# Patient Record
Sex: Female | Born: 1970 | Race: White | Hispanic: No | Smoking: Never smoker
Health system: Southern US, Community
[De-identification: ages and names within clinical notes are randomized; demographics above are authoritative.]

## PROBLEM LIST (undated history)

## (undated) DIAGNOSIS — L509 Urticaria, unspecified: Secondary | ICD-10-CM

## (undated) DIAGNOSIS — G43909 Migraine, unspecified, not intractable, without status migrainosus: Secondary | ICD-10-CM

## (undated) HISTORY — DX: Urticaria, unspecified: L50.9

## (undated) HISTORY — DX: Migraine, unspecified, not intractable, without status migrainosus: G43.909

---

## 1995-09-30 HISTORY — PX: THYROIDECTOMY, PARTIAL: SHX18

## 2017-11-18 ENCOUNTER — Encounter: Payer: Self-pay | Admitting: Sports Medicine

## 2017-11-18 ENCOUNTER — Ambulatory Visit (INDEPENDENT_AMBULATORY_CARE_PROVIDER_SITE_OTHER): Payer: 59 | Admitting: Sports Medicine

## 2017-11-18 ENCOUNTER — Ambulatory Visit (INDEPENDENT_AMBULATORY_CARE_PROVIDER_SITE_OTHER): Payer: 59

## 2017-11-18 VITALS — Ht 62.0 in | Wt 130.0 lb

## 2017-11-18 DIAGNOSIS — M722 Plantar fascial fibromatosis: Secondary | ICD-10-CM | POA: Diagnosis not present

## 2017-11-18 DIAGNOSIS — M79672 Pain in left foot: Secondary | ICD-10-CM | POA: Diagnosis not present

## 2017-11-18 DIAGNOSIS — M2140 Flat foot [pes planus] (acquired), unspecified foot: Secondary | ICD-10-CM

## 2017-11-18 MED ORDER — TRIAMCINOLONE ACETONIDE 10 MG/ML IJ SUSP: 10.0000 mg | Freq: Once | INTRAMUSCULAR | Status: DC

## 2017-11-18 MED ORDER — MELOXICAM 15 MG PO TABS: 15.0000 mg | ORAL_TABLET | Freq: Every day | ORAL | 0 refills | Status: DC

## 2017-11-18 MED ORDER — METHYLPREDNISOLONE 4 MG PO TBPK: ORAL_TABLET | ORAL | 0 refills | Status: DC

## 2017-11-18 NOTE — Progress Notes (Signed)
Subjective: Christina Nixon is a 47 y.o. female patient presents to office with complaint of moderate heel pain on the left since December.  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  Patient has treated this problem with massage, stretching, icing, night splint, changing shoes, arch pillow, and over-the-counter inserts with no relief. Denies any other pedal complaints.   Review of Systems  Musculoskeletal:       Pain in left heel  All other systems reviewed and are negative.    There are no active problems to display for this patient.   No current outpatient medications on file prior to visit.   No current facility-administered medications on file prior to visit.     Allergies not on file  Objective: Physical Exam General: The patient is alert and oriented x3 in no acute distress.  Dermatology: Skin is warm, dry and supple bilateral lower extremities. Nails 1-10 are normal. There is no erythema, no edema, there is faint eccymosis at sinus tarsi on left, no open lesions present. Integument is otherwise unremarkable.  Vascular: Dorsalis Pedis pulse and Posterior Tibial pulse are 2/4 bilateral. Capillary fill time is immediate to all digits.  Neurological: Grossly intact to light touch with an achilles reflex of +2/5 and a  negative Tinel's sign bilateral.  Subjective tingling along the left plantar surface.  Musculoskeletal: Tenderness to palpation at the medial calcaneal tubercale and through the insertion of the plantar fascia on the left foot. No pain with compression of calcaneus bilateral. No pain with tuning fork to calcaneus bilateral. No pain with calf compression bilateral. There is decreased Ankle joint range of  motion bilateral. + Severe pes planus deformity bilateral left greater than right. All other joints range of motion within normal limits bilateral. Strength 5/5 in all groups bilateral.   Gait: Unassisted, Antalgic avoid weight on left/right heel  Xray, Left foot:  Normal osseous mineralization. Joint spaces preserved. No fracture/dislocation/boney destruction. Calcaneal spur present with mild thickening of plantar fascia. No other soft tissue abnormalities or radiopaque foreign bodies.   Assessment and Plan: Problem List Items Addressed This Visit    None    Visit Diagnoses    Plantar fasciitis, left    -  Primary   Relevant Medications   methylPREDNISolone (MEDROL DOSEPAK) 4 MG TBPK tablet   meloxicam (MOBIC) 15 MG tablet   triamcinolone acetonide (KENALOG) 10 MG/ML injection 10 mg (Start on 11/18/2017  5:45 PM)   Other Relevant Orders   DG Foot Complete Left   Pes planus, unspecified laterality       Relevant Orders   DG Foot Complete Left   Left foot pain       Relevant Orders   DG Foot Complete Left      -Complete examination performed.  -Xrays reviewed -Discussed with patient in detail the condition of plantar fasciitis, how this occurs and general treatment options. Explained both conservative and surgical treatments.  -After oral consent and aseptic prep, injected a mixture containing 1 ml of 2%  plain lidocaine, 1 ml 0.5% plain marcaine, 0.5 ml of kenalog 10 and 0.5 ml of dexamethasone phosphate into left heel -Rx Meloxicam to start after Medrol dose pack is completed -Recommended  good supportive shoes and advised use of OTC insert. Explained to patient that if these orthoses work well, we will continue with these. If these do not improve her condition and  pain, we will consider custom molded orthoses. -Explained in detail the use of the fascial brace for the left which was dispensed at today's visit. -Patient to resume the night splint that she already has at  home -Explained and dispensed to patient daily stretching exercises. -Recommend patient to ice affected area 1-2x daily. -Patient to return to office in 3-4 weeks for follow up or sooner if problems or questions arise.  Christina Nixon, DPM

## 2017-11-18 NOTE — Patient Instructions (Signed)

## 2017-12-11 ENCOUNTER — Encounter: Payer: Self-pay | Admitting: Sports Medicine

## 2017-12-11 ENCOUNTER — Ambulatory Visit (INDEPENDENT_AMBULATORY_CARE_PROVIDER_SITE_OTHER): Payer: 59 | Admitting: Sports Medicine

## 2017-12-11 DIAGNOSIS — M79672 Pain in left foot: Secondary | ICD-10-CM

## 2017-12-11 DIAGNOSIS — M722 Plantar fascial fibromatosis: Secondary | ICD-10-CM | POA: Diagnosis not present

## 2017-12-11 MED ORDER — TRIAMCINOLONE ACETONIDE 40 MG/ML IJ SUSP: 20.0000 mg | Freq: Once | INTRAMUSCULAR | Status: DC

## 2017-12-11 NOTE — Progress Notes (Signed)
Subjective: Christina Nixon is a 47 y.o. female returns to office for follow up evaluation after Left heel injection for plantar fasciitis, injection #1 administered 3 weeks ago. Patient states that the injection seems to help her pain; pain is now 3/10 or 70% better and has  decreased in frequency to the area. Patient states that she has been compliant with using her night splint icing stretching however when she tried to use the plantar fascial brace feel it rubbing and felt like it irritated the area of her heel.  Patient states that she has also completed her oral medications with no issues. Patient states on Monday she walked her dog half a mile and did not have any pain.  States that her heel cushions helped tremendously.  Denies any recent changes in medications or new problems since last visit.   There are no active problems to display for this patient.   Current Outpatient Medications on File Prior to Visit  Medication Sig Dispense Refill  . meloxicam (MOBIC) 15 MG tablet Take 1 tablet (15 mg total) by mouth daily. 30 tablet 0  . methylPREDNISolone (MEDROL DOSEPAK) 4 MG TBPK tablet Take 1st as instructed 21 tablet 0   Current Facility-Administered Medications on File Prior to Visit  Medication Dose Route Frequency Provider Last Rate Last Dose  . triamcinolone acetonide (KENALOG) 10 MG/ML injection 10 mg  10 mg Other Once Asencion IslamStover, Lelia Jons, DPM        Not on File  Objective:   General:  Alert and oriented x 3, in no acute distress  Dermatology: Skin is warm, dry, and supple bilateral. Nails are within normal limits. There is no lower extremity erythema, no eccymosis, no open lesions present bilateral.   Vascular: Dorsalis Pedis and Posterior Tibial pedal pulses are 2/4 bilateral. + hair growth noted bilateral. Capillary Fill Time is 3 seconds in all digits. No varicosities, No edema bilateral lower extremities.   Neurological: Sensation grossly intact to light touch with an achilles  reflex of +2 and a  negative Tinel's sign bilateral. Vibratory, sharp/dull, Semmes Weinstein Monofilament within normal limits.   Musculoskeletal: There is decreased tenderness to palpation at the medial calcaneal tubercale and through the insertion of the plantar fascia on the Left foot. No pain with compression to calcaneus or application of tuning fork. There is decreased Ankle joint range of motion bilateral. All other joints range of motion within normal limits bilateral. + Pes planus deformity bilateral left greater than right.  Strength 5/5 bilateral.   Assessment and Plan: Problem List Items Addressed This Visit    None    Visit Diagnoses    Plantar fasciitis, left    -  Primary   Relevant Medications   triamcinolone acetonide (KENALOG-40) injection 20 mg   Left foot pain         -Complete examination performed.  -Previous x-rays reviewed. -Discussed with patient in detail the condition of plantar fasciitis, how this occurs related to the foot type of the patient and general treatment options. - Patient opted for another injection today; After oral consent and aseptic prep, injected a mixture containing 1 ml of 1%plain lidocaine, 1 ml 0.5% plain marcaine, 0.5 ml of kenalog 40 and 0.5 ml of dexmethasone phosphate to left heel at area of most pain/trigger point injection. -Dispensed Left heel lifts -Patient to finish off the meloxicam PRN  -Continue with stretching, icing, good supportive shoes.  -Discussed long term care and reocurrence; will closely monitor; if fails to improve  will consider other treatment modalities.  -Patient to return to office in 1 month for follow up or sooner if problems or questions arise.  Asencion Islam, DPM

## 2018-01-08 ENCOUNTER — Ambulatory Visit: Payer: 59 | Admitting: Sports Medicine

## 2018-01-10 DIAGNOSIS — Z01419 Encounter for gynecological examination (general) (routine) without abnormal findings: Secondary | ICD-10-CM | POA: Insufficient documentation

## 2018-01-10 DIAGNOSIS — R0789 Other chest pain: Secondary | ICD-10-CM | POA: Insufficient documentation

## 2018-01-10 DIAGNOSIS — N921 Excessive and frequent menstruation with irregular cycle: Secondary | ICD-10-CM | POA: Insufficient documentation

## 2018-01-20 ENCOUNTER — Ambulatory Visit (INDEPENDENT_AMBULATORY_CARE_PROVIDER_SITE_OTHER): Payer: 59 | Admitting: Sports Medicine

## 2018-01-20 ENCOUNTER — Encounter: Payer: Self-pay | Admitting: Sports Medicine

## 2018-01-20 DIAGNOSIS — M722 Plantar fascial fibromatosis: Secondary | ICD-10-CM | POA: Diagnosis not present

## 2018-01-20 DIAGNOSIS — M79672 Pain in left foot: Secondary | ICD-10-CM

## 2018-01-20 DIAGNOSIS — M2140 Flat foot [pes planus] (acquired), unspecified foot: Secondary | ICD-10-CM

## 2018-01-20 MED ORDER — TRIAMCINOLONE ACETONIDE 40 MG/ML IJ SUSP: 20.0000 mg | Freq: Once | INTRAMUSCULAR | Status: DC

## 2018-01-20 NOTE — Progress Notes (Signed)
Subjective: Christina Nixon is a 47 y.o. female returns to office for follow up evaluation after Left heel injection for plantar fasciitis, injection #2 administered 5 weeks ago. Patient states that the injection seems to help her pain; pain is now 3-4/10 and has  decreased in frequency to the area but not yet completely gone states that for the most part she can do normal activities however after some episodes of yard work has had a flareup. Patient states that she has been compliant with using her night splint, icing, stretching, and wearing good supportive shoes and wearing her heel cushions.  Denies any recent changes in medications or new problems since last visit.   There are no active problems to display for this patient.   Current Outpatient Medications on File Prior to Visit  Medication Sig Dispense Refill  . meloxicam (MOBIC) 15 MG tablet Take 1 tablet (15 mg total) by mouth daily. 30 tablet 0  . methylPREDNISolone (MEDROL DOSEPAK) 4 MG TBPK tablet Take 1st as instructed 21 tablet 0   Current Facility-Administered Medications on File Prior to Visit  Medication Dose Route Frequency Provider Last Rate Last Dose  . triamcinolone acetonide (KENALOG) 10 MG/ML injection 10 mg  10 mg Other Once Callery, Earle Troiano, DPM      . triamcinolone acetonide (KENALOG-40) injection 20 mg  20 mg Other Once Asencion Islam, DPM        Not on File  Objective:   General:  Alert and oriented x 3, in no acute distress  Dermatology: Skin is warm, dry, and supple bilateral. Nails are within normal limits. There is no lower extremity erythema, no eccymosis, no open lesions present bilateral.   Vascular: Dorsalis Pedis and Posterior Tibial pedal pulses are 2/4 bilateral. + hair growth noted bilateral. Capillary Fill Time is 3 seconds in all digits. No varicosities, No edema bilateral lower extremities.   Neurological: Sensation grossly intact to light touch with an achilles reflex of +2 and a  negative  Tinel's sign bilateral. Vibratory, sharp/dull, Semmes Weinstein Monofilament within normal limits.   Musculoskeletal: There is decreased tenderness to palpation at the medial calcaneal tubercale and through the insertion of the plantar fascia on the Left foot. No pain with compression to calcaneus or application of tuning fork. There is decreased Ankle joint range of motion bilateral. All other joints range of motion within normal limits bilateral. + Pes planus deformity bilateral left greater than right.  Strength 5/5 bilateral.   Assessment and Plan: Problem List Items Addressed This Visit    None    Visit Diagnoses    Plantar fasciitis, left    -  Primary   Relevant Medications   triamcinolone acetonide (KENALOG-40) injection 20 mg   Left foot pain       Pes planus, unspecified laterality         -Complete examination performed.  -Previous x-rays reviewed. -Discussed with patient in detail the condition of plantar fasciitis, how this occurs related to the foot type of the patient and general treatment options. - Patient opted for another injection today; After oral consent and aseptic prep, injected a mixture containing 1 ml of 1%plain lidocaine, 1 ml 0.5% plain marcaine, 0.5 ml of kenalog 40 and 0.5 ml of dexmethasone phosphate to left heel at area of most pain/trigger point injection.  This is injection #3 to the site. -Dispensed Left heel lifts -Applied plantar fascial strapping to left foot and advised patient to keep in place for 5 days -Continue with  stretching, icing, good supportive shoes.  -Discussed long term care and reocurrence; will closely monitor; if fails to improve will consider other treatment modalities.  -Patient to return to office in 3 to 4 weeks for follow up or sooner if problems or questions arise.  Asencion Islamitorya Migdalia Olejniczak, DPM

## 2018-01-20 NOTE — Patient Instructions (Signed)

## 2018-02-11 ENCOUNTER — Ambulatory Visit: Payer: 59 | Admitting: Sports Medicine

## 2018-02-12 ENCOUNTER — Encounter: Payer: Self-pay | Admitting: Sports Medicine

## 2018-02-12 ENCOUNTER — Ambulatory Visit (INDEPENDENT_AMBULATORY_CARE_PROVIDER_SITE_OTHER): Payer: 59 | Admitting: Sports Medicine

## 2018-02-12 DIAGNOSIS — M79672 Pain in left foot: Secondary | ICD-10-CM

## 2018-02-12 DIAGNOSIS — M2140 Flat foot [pes planus] (acquired), unspecified foot: Secondary | ICD-10-CM

## 2018-02-12 DIAGNOSIS — M722 Plantar fascial fibromatosis: Secondary | ICD-10-CM | POA: Diagnosis not present

## 2018-02-12 NOTE — Progress Notes (Signed)
Subjective: Christina Nixon is a 47 y.o. female returns to office for follow up evaluation after Left heel injection for plantar fasciitis, injection #3 administered 4 weeks ago. Patient states that the injection seems to help her pain; pain is now 1-2/10 and has  decreased in frequency to the area; had a flare after mowing and after beach trip. Patient states that she has been compliant with using her night splint, icing, stretching, and wearing good supportive shoes and wearing her heel cushions.  Denies any recent changes in medications or new problems since last visit.   There are no active problems to display for this patient.   Current Outpatient Medications on File Prior to Visit  Medication Sig Dispense Refill  . meloxicam (MOBIC) 15 MG tablet Take 1 tablet (15 mg total) by mouth daily. 30 tablet 0  . methylPREDNISolone (MEDROL DOSEPAK) 4 MG TBPK tablet Take 1st as instructed 21 tablet 0   Current Facility-Administered Medications on File Prior to Visit  Medication Dose Route Frequency Provider Last Rate Last Dose  . triamcinolone acetonide (KENALOG) 10 MG/ML injection 10 mg  10 mg Other Once Powell, Mubarak Bevens, DPM      . triamcinolone acetonide (KENALOG-40) injection 20 mg  20 mg Other Once Hamilton, Royelle Hinchman, DPM      . triamcinolone acetonide (KENALOG-40) injection 20 mg  20 mg Other Once Asencion Islam, DPM        Not on File  Objective:   General:  Alert and oriented x 3, in no acute distress  Dermatology: Skin is warm, dry, and supple bilateral. Nails are within normal limits. There is no lower extremity erythema, no eccymosis, no open lesions present bilateral.   Vascular: Dorsalis Pedis and Posterior Tibial pedal pulses are 2/4 bilateral. + hair growth noted bilateral. Capillary Fill Time is 3 seconds in all digits. No varicosities, No edema bilateral lower extremities.   Neurological: Sensation grossly intact to light touch with an achilles reflex of +2 and a  negative  Tinel's sign bilateral. Vibratory, sharp/dull, Semmes Weinstein Monofilament within normal limits.   Musculoskeletal: There is decreased tenderness to palpation at the central> medial calcaneal tubercale and through the insertion of the plantar fascia on the Left foot. No pain with compression to calcaneus or application of tuning fork. There is decreased Ankle joint range of motion bilateral. All other joints range of motion within normal limits bilateral. + Pes planus deformity bilateral left greater than right.  Strength 5/5 bilateral.   Assessment and Plan: Problem List Items Addressed This Visit    None    Visit Diagnoses    Plantar fasciitis, left    -  Primary   Left foot pain       Pes planus, unspecified laterality         -Complete examination performed.  -Previous x-rays reviewed. -Re-Discussed with patient in detail the condition of plantar fasciitis, how this occurs related to the foot type of the patient and general treatment options. -Applied plantar fascial strapping to left foot and advised patient to keep in place for 5 days -Continue with stretching and good supportive shoes. -May continue with Mobic and icing for flares -Office to check orthotic benefits (accommodative style was what the patient prefers)  -Discussed long term care and reocurrence; will closely monitor; if fails to improve will consider other treatment modalities.  -Patient to return to office in 4 weeks for [facility] and for follow up eval or sooner if problems or questions arise.  Brendolyn Stockley  [name], DPM

## 2018-03-04 ENCOUNTER — Encounter: Payer: Self-pay | Admitting: Sports Medicine

## 2018-03-04 ENCOUNTER — Telehealth: Payer: Self-pay | Admitting: *Deleted

## 2018-03-04 ENCOUNTER — Ambulatory Visit (INDEPENDENT_AMBULATORY_CARE_PROVIDER_SITE_OTHER): Payer: 59 | Admitting: Sports Medicine

## 2018-03-04 DIAGNOSIS — M722 Plantar fascial fibromatosis: Secondary | ICD-10-CM

## 2018-03-04 MED ORDER — NONFORMULARY OR COMPOUNDED ITEM: 2 refills | Status: DC

## 2018-03-04 NOTE — Addendum Note (Signed)
Addended by: Alphia Kava'CONNELL, Brissa Asante D on: 03/04/2018 06:47 PM   Modules accepted: Orders

## 2018-03-04 NOTE — Progress Notes (Signed)
Subjective: Christina Nixon is a 47 y.o. female returns to office for follow up evaluation left heel pain.  Patient also reports that this visit that her right foot is starting to hurt the same pain is still about 1-2 out of 10 with occasional flares especially with yard work and with intensive house cleaning.  Patient states that she has been compliant with using her night splint, icing, stretching, and wearing good supportive shoes and wearing her heel cushions.  Reports that she only kept the taping on the last time for 1 day and states that she did a lot of yard work and it got dirty so had to remove it.  Patient denies any recent changes in medications or new problems since last visit.   There are no active problems to display for this patient.   Current Outpatient Medications on File Prior to Visit  Medication Sig Dispense Refill  . meloxicam (MOBIC) 15 MG tablet Take 1 tablet (15 mg total) by mouth daily. 30 tablet 0  . methylPREDNISolone (MEDROL DOSEPAK) 4 MG TBPK tablet Take 1st as instructed 21 tablet 0   Current Facility-Administered Medications on File Prior to Visit  Medication Dose Route Frequency Provider Last Rate Last Dose  . triamcinolone acetonide (KENALOG) 10 MG/ML injection 10 mg  10 mg Other Once [place], Allexis Bordenave, DPM      . triamcinolone acetonide (KENALOG-40) injection 20 mg  20 mg Other Once Kirklin, Kaleab Frasier, DPM      . triamcinolone acetonide (KENALOG-40) injection 20 mg  20 mg Other Once Asencion Islam, DPM        Not on File  Objective:   General:  Alert and oriented x 3, in no acute distress  Dermatology: Skin is warm, dry, and supple bilateral. Nails are within normal limits. There is no lower extremity erythema, no eccymosis, no open lesions present bilateral.   Vascular: Dorsalis Pedis and Posterior Tibial pedal pulses are 2/4 bilateral. + hair growth noted bilateral. Capillary Fill Time is 3 seconds in all digits. No varicosities, No edema bilateral lower  extremities.   Neurological: Sensation grossly intact to light touch with an achilles reflex of +2 and a  negative Tinel's sign bilateral. Vibratory, sharp/dull, Semmes Weinstein Monofilament within normal limits.   Musculoskeletal: There is decreased tenderness to palpation at the central> medial calcaneal tubercale and through the insertion of the plantar fascia on the Left foot however there is some mild pain along the insertion of the plantar fascia on the right. No pain with compression to calcaneus or application of tuning fork. There is decreased Ankle joint range of motion bilateral. All other joints range of motion within normal limits bilateral. + Pes planus deformity bilateral left greater than right.  Strength 5/5 bilateral.   Assessment and Plan: Problem List Items Addressed This Visit    None    Visit Diagnoses    Plantar fasciitis, left    -  Primary   Plantar fasciitis, right         -Complete examination performed.  -Previous x-rays reviewed. -Re-Discussed with patient in detail the condition of plantar fasciitis now bilateral, how this occurs related to the foot type of the patient and general treatment options. -Prescribed physical therapy at [facility] in [place] -Prescribe topical pain cream from [facility] to use as instructed -Continue with stretching and good supportive shoes. -Patient was evaluated and casted today for custom molded orthotics by pedorthotist -May continue with Mobic and icing for flares -Discussed long term care  and reocurrence; will closely monitor; if fails to improve will consider other treatment modalities if symptoms fail to improve. -Patient to return to office pick up orthotics or sooner if questions arise.  Asencion Islamitorya Porschea Borys, DPM

## 2018-03-04 NOTE — Telephone Encounter (Signed)
[facility]

## 2018-03-04 NOTE — Telephone Encounter (Signed)
-----   Message from Asencion Islamitorya Stover, [place] sent at 03/04/2018  3:50 PM EDT ----- Regarding: [facility]  Pain cream add ketamine

## 2018-03-05 DIAGNOSIS — Z8249 Family history of ischemic heart disease and other diseases of the circulatory system: Secondary | ICD-10-CM | POA: Insufficient documentation

## 2018-03-05 DIAGNOSIS — F988 Other specified behavioral and emotional disorders with onset usually occurring in childhood and adolescence: Secondary | ICD-10-CM | POA: Insufficient documentation

## 2018-03-05 DIAGNOSIS — I10 Essential (primary) hypertension: Secondary | ICD-10-CM | POA: Insufficient documentation

## 2018-04-06 ENCOUNTER — Other Ambulatory Visit: Payer: Self-pay | Admitting: Sports Medicine

## 2018-04-06 DIAGNOSIS — M722 Plantar fascial fibromatosis: Secondary | ICD-10-CM

## 2018-04-08 ENCOUNTER — Ambulatory Visit: Payer: 59 | Admitting: *Deleted

## 2018-04-08 DIAGNOSIS — M722 Plantar fascial fibromatosis: Secondary | ICD-10-CM

## 2018-04-08 DIAGNOSIS — M2140 Flat foot [pes planus] (acquired), unspecified foot: Secondary | ICD-10-CM

## 2018-04-08 NOTE — Patient Instructions (Signed)

## 2018-07-05 DIAGNOSIS — G4452 New daily persistent headache (NDPH): Secondary | ICD-10-CM | POA: Insufficient documentation

## 2018-07-11 ENCOUNTER — Emergency Department (HOSPITAL_COMMUNITY)
Admission: EM | Admit: 2018-07-11 | Discharge: 2018-07-12 | Disposition: A | Discharge status: Home / Self Care | Payer: Managed Care, Other (non HMO) | Attending: Emergency Medicine | Admitting: Emergency Medicine

## 2018-07-11 ENCOUNTER — Encounter (HOSPITAL_COMMUNITY): Payer: Self-pay | Admitting: Emergency Medicine

## 2018-07-11 ENCOUNTER — Emergency Department (HOSPITAL_COMMUNITY): Payer: Managed Care, Other (non HMO)

## 2018-07-11 DIAGNOSIS — R51 Headache: Secondary | ICD-10-CM | POA: Insufficient documentation

## 2018-07-11 DIAGNOSIS — Z79899 Other long term (current) drug therapy: Secondary | ICD-10-CM | POA: Diagnosis not present

## 2018-07-11 DIAGNOSIS — R519 Headache, unspecified: Secondary | ICD-10-CM

## 2018-07-11 MED ORDER — PROCHLORPERAZINE EDISYLATE 10 MG/2ML IJ SOLN
10.0000 mg | Freq: Once | INTRAMUSCULAR | Status: AC
  Administered 2018-07-11: 10 mg via INTRAVENOUS
  Filled 2018-07-11: qty 2

## 2018-07-11 MED ORDER — MORPHINE SULFATE (PF) 4 MG/ML IV SOLN
4.0000 mg | Freq: Once | INTRAVENOUS | Status: AC
  Administered 2018-07-11: 4 mg via INTRAVENOUS
  Filled 2018-07-11: qty 1

## 2018-07-11 MED ORDER — SODIUM CHLORIDE 0.9 % IV BOLUS
1000.0000 mL | Freq: Once | INTRAVENOUS | Status: AC
  Administered 2018-07-11: 1000 mL via INTRAVENOUS

## 2018-07-11 NOTE — ED Notes (Signed)
Pt refused CT scan. MD notified.

## 2018-07-11 NOTE — ED Notes (Signed)
Patient transported to MRI 

## 2018-07-11 NOTE — ED Triage Notes (Signed)
Reports headache ongoing since October 2nd.  Has been treating with family doctor and seen at [facility] ER.  Given headache cocktail earlier today.  Reports pain decreased but never went away.  Immediately returned upon leaving. They did not do CT scan as of yet.  Taking imitrex and aleve otc as well.

## 2018-07-11 NOTE — ED Provider Notes (Signed)
[facility] EMERGENCY DEPARTMENT Provider Note   CSN: [id number] Arrival date & time: 07/11/18  1933     History   Chief Complaint Chief Complaint  Patient presents with  . Headache    HPI Kelee Cunningham is a 47 y.o. female.  HPI Patient is a 47 year old female presents the emergency department ongoing severe headaches since June 30, 2018.  She has tried several over-the-counter medications including prescription Imitrex by her primary care physician.  None of this seems to be improving with her headache.  She denies weakness of her arms or legs.  She is been able to continue to work on her computer throughout the week.  She states she has a stressful job but she needs to continue to work.  She is recently had her eyes evaluated by an optometrist and she has no refractory issues.  She has not had imaging to this point.  There is a family history of clotting disorders but she has never had a DVT or pulmonary embolism.  She has no recent head injury or trauma.  No fevers or chills.  No neck pain or stiffness.  No altered mental status.  No ataxia or changes in her speech.  She presents the emergency department tonight requesting MRI imaging as requested by her primary care physician.  No other complaints.   History reviewed. No pertinent past medical history.  There are no active problems to display for this patient.   Past Surgical History:  Procedure Laterality Date  . THYROIDECTOMY, PARTIAL       OB History   None      Home Medications    Prior to Admission medications   Medication Sig Start Date End Date Taking? Authorizing Provider  meloxicam (MOBIC) 15 MG tablet TAKE 1 TABLET BY MOUTH EVERY DAY 04/09/18   Asencion Islam, DPM  methylPREDNISolone (MEDROL DOSEPAK) 4 MG TBPK tablet Take 1st as instructed 11/18/17   Asencion Islam, DPM  NONFORMULARY OR COMPOUNDED ITEM [facility]:  Combination Pain Cream - Baclofen 2%, Doxepin 5%, Gabapentin  6%, Topiramate 2%, Pentoxifylline 3%, Ketamine 10%, apply 1-2 grams to affected areas 3-4 times a day. 03/04/18   Asencion Islam, DPM    Family History No family history on file.  Social History Social History   Tobacco Use  . Smoking status: Never Smoker  . Smokeless tobacco: Never Used  Substance Use Topics  . Alcohol use: Yes    Alcohol/week: 2.0 standard drinks    Types: 2 Cans of beer per week    Comment: daily  . Drug use: Never     Allergies   Patient has no allergy information on record.   Review of Systems Review of Systems  All other systems reviewed and are negative.    Physical Exam Updated Vital Signs BP 114/79   Pulse 72   Temp 98.1 F (36.7 C) (Oral)   Resp 19   Ht 5\' 2"  (1.575 m)   Wt 58.1 kg   SpO2 97%   BMI 23.41 kg/m   Physical Exam  Constitutional: She is oriented to person, place, and time. She appears well-developed and well-nourished. No distress.  HENT:  Head: Normocephalic and atraumatic.  Eyes: Pupils are equal, round, and reactive to light. EOM are normal.  Neck: Normal range of motion.  Cardiovascular: Normal rate, regular rhythm and normal heart sounds.  Pulmonary/Chest: Effort normal and breath sounds normal.  Abdominal: Soft. She exhibits no distension. There is no tenderness.  Musculoskeletal:  Normal range of motion.  Neurological: She is alert and oriented to person, place, and time.  5/5 strength in major muscle groups of  bilateral upper and lower extremities. Speech normal. No facial asymetry.  Normal finger-to-nose bilaterally.  Skin: Skin is warm and dry.  Psychiatric: She has a normal mood and affect. Judgment normal.  Nursing note and vitals reviewed.    ED Treatments / Results  Labs (all labs ordered are listed, but only abnormal results are displayed) Labs Reviewed - No data to display  EKG None  Radiology No results found.  Procedures Procedures (including critical care time)  Medications Ordered in  ED Medications  prochlorperazine (COMPAZINE) injection 10 mg (10 mg Intravenous Given 07/11/18 2121)  morphine 4 MG/ML injection 4 mg (4 mg Intravenous Given 07/11/18 2121)  sodium chloride 0.9 % bolus 1,000 mL (0 mLs Intravenous Stopped 07/11/18 2223)     Initial Impression / Assessment and Plan / ED Course  I have reviewed the triage vital signs and the nursing notes.  Pertinent labs & imaging results that were available during my care of the patient were reviewed by me and considered in my medical decision making (see chart for details).     Headache cocktail now.  Atypical migraine type headache likely.  Given persistence of symptoms and no imaging at this point she will undergo MR brain, MR angiogram head, MRV.  If these are all negative the patient understands that she will need outpatient referral to neurology and ongoing follow-up with her primary care physician.  Care to [place], [place]  Final Clinical Impressions(s) / ED Diagnoses   Final diagnoses:  None    ED Discharge Orders    None       Azalia Bilis, MD 07/11/18 2228

## 2018-07-12 MED ORDER — PROCHLORPERAZINE MALEATE 10 MG PO TABS: 10.0000 mg | ORAL_TABLET | Freq: Three times a day (TID) | ORAL | 0 refills | Status: DC | PRN

## 2018-07-12 NOTE — Discharge Instructions (Addendum)
You can try taking the compazine with 800mg  ibuprofen and 25mg  of Benadryl for headache.   You could take this every 8 hours as needed.  Drink plenty of fluid.

## 2018-07-12 NOTE — ED Notes (Signed)
Patient Back from MRI

## 2018-07-12 NOTE — ED Provider Notes (Signed)
Patient signed out to me by Dr. Patria Mane awaiting MRs of head.  Patient has had persistent headache x about 2 weeks.  Sent to ED for MR.  MRs as below.  Recommend neurology follow-up.  Discussed follow-up plan with patient.  No results found for this or any previous visit. Mr Maxine Glenn Head Wo Contrast  Result Date: 07/12/2018 CLINICAL DATA:  Acute headache EXAM: MRI HEAD WITHOUT CONTRAST MRA HEAD WITHOUT CONTRAST MRV HEAD WITHOUT CONTRAST TECHNIQUE: Multiplanar, multiecho pulse sequences of the brain and surrounding structures were obtained without intravenous contrast. Angiographic images of the intracranial venous structures were obtained using MRV technique without intravenous contrast. Angiographic time-of-flight images of the intracranial arteries were obtained without intravenous contrast. COMPARISON:  None. FINDINGS: MRI HEAD FINDINGS BRAIN: There is no acute infarct, acute hemorrhage or mass effect. The midline structures are normal. There are no old infarcts. Scattered foci of hyperintense T2-weighted signal within the white matter in a nonspecific pattern that may be seen in the context of migraine headaches, but is also seen in asymptomatic patients. The CSF spaces are normal for age, with no hydrocephalus. Susceptibility-sensitive sequences show no chronic microhemorrhage or superficial siderosis. SKULL AND UPPER CERVICAL SPINE: The visualized skull base, calvarium, upper cervical spine and extracranial soft tissues are normal. SINUSES/ORBITS: No fluid levels or advanced mucosal thickening. No mastoid or middle ear effusion. The orbits are normal. MRA HEAD FINDINGS Intracranial internal carotid arteries: Normal. Anterior cerebral arteries: Normal. Middle cerebral arteries: Normal. Posterior communicating arteries: Not visualized Posterior cerebral arteries: Normal. Basilar artery: Normal. Vertebral arteries: Left dominant. Normal. Superior cerebellar arteries: Normal. Inferior cerebellar arteries:  Normal. MRV HEAD FINDINGS Superior sagittal sinus: Normal. Straight sinus: Normal. Inferior sagittal sinus, vein of Galen and internal cerebral veins: Normal. Transverse sinuses: Normal. Sigmoid sinuses: Normal. Visualized jugular veins: Normal. IMPRESSION: 1. Nonspecific scattered foci of white matter hyperintensity. Though this may be seen in the setting of migraine headaches or vasculopathy such as early chronic small vessel disease, it is also seen in normal patients of this age group. 2. Normal intracranial MRA/MRV. Electronically Signed   By: Deatra Robinson M.D.   On: 07/12/2018 00:37   Mr Brain Wo Contrast  Result Date: 07/12/2018 CLINICAL DATA:  Acute headache EXAM: MRI HEAD WITHOUT CONTRAST MRA HEAD WITHOUT CONTRAST MRV HEAD WITHOUT CONTRAST TECHNIQUE: Multiplanar, multiecho pulse sequences of the brain and surrounding structures were obtained without intravenous contrast. Angiographic images of the intracranial venous structures were obtained using MRV technique without intravenous contrast. Angiographic time-of-flight images of the intracranial arteries were obtained without intravenous contrast. COMPARISON:  None. FINDINGS: MRI HEAD FINDINGS BRAIN: There is no acute infarct, acute hemorrhage or mass effect. The midline structures are normal. There are no old infarcts. Scattered foci of hyperintense T2-weighted signal within the white matter in a nonspecific pattern that may be seen in the context of migraine headaches, but is also seen in asymptomatic patients. The CSF spaces are normal for age, with no hydrocephalus. Susceptibility-sensitive sequences show no chronic microhemorrhage or superficial siderosis. SKULL AND UPPER CERVICAL SPINE: The visualized skull base, calvarium, upper cervical spine and extracranial soft tissues are normal. SINUSES/ORBITS: No fluid levels or advanced mucosal thickening. No mastoid or middle ear effusion. The orbits are normal. MRA HEAD FINDINGS Intracranial internal  carotid arteries: Normal. Anterior cerebral arteries: Normal. Middle cerebral arteries: Normal. Posterior communicating arteries: Not visualized Posterior cerebral arteries: Normal. Basilar artery: Normal. Vertebral arteries: Left dominant. Normal. Superior cerebellar arteries: Normal. Inferior cerebellar arteries: Normal. MRV HEAD FINDINGS Superior  sagittal sinus: Normal. Straight sinus: Normal. Inferior sagittal sinus, vein of Galen and internal cerebral veins: Normal. Transverse sinuses: Normal. Sigmoid sinuses: Normal. Visualized jugular veins: Normal. IMPRESSION: 1. Nonspecific scattered foci of white matter hyperintensity. Though this may be seen in the setting of migraine headaches or vasculopathy such as early chronic small vessel disease, it is also seen in normal patients of this age group. 2. Normal intracranial MRA/MRV. Electronically Signed   By: Deatra Robinson M.D.   On: 07/12/2018 00:37   Mr Mrv Head Wo Cm  Result Date: 07/12/2018 CLINICAL DATA:  Acute headache EXAM: MRI HEAD WITHOUT CONTRAST MRA HEAD WITHOUT CONTRAST MRV HEAD WITHOUT CONTRAST TECHNIQUE: Multiplanar, multiecho pulse sequences of the brain and surrounding structures were obtained without intravenous contrast. Angiographic images of the intracranial venous structures were obtained using MRV technique without intravenous contrast. Angiographic time-of-flight images of the intracranial arteries were obtained without intravenous contrast. COMPARISON:  None. FINDINGS: MRI HEAD FINDINGS BRAIN: There is no acute infarct, acute hemorrhage or mass effect. The midline structures are normal. There are no old infarcts. Scattered foci of hyperintense T2-weighted signal within the white matter in a nonspecific pattern that may be seen in the context of migraine headaches, but is also seen in asymptomatic patients. The CSF spaces are normal for age, with no hydrocephalus. Susceptibility-sensitive sequences show no chronic microhemorrhage or  superficial siderosis. SKULL AND UPPER CERVICAL SPINE: The visualized skull base, calvarium, upper cervical spine and extracranial soft tissues are normal. SINUSES/ORBITS: No fluid levels or advanced mucosal thickening. No mastoid or middle ear effusion. The orbits are normal. MRA HEAD FINDINGS Intracranial internal carotid arteries: Normal. Anterior cerebral arteries: Normal. Middle cerebral arteries: Normal. Posterior communicating arteries: Not visualized Posterior cerebral arteries: Normal. Basilar artery: Normal. Vertebral arteries: Left dominant. Normal. Superior cerebellar arteries: Normal. Inferior cerebellar arteries: Normal. MRV HEAD FINDINGS Superior sagittal sinus: Normal. Straight sinus: Normal. Inferior sagittal sinus, vein of Galen and internal cerebral veins: Normal. Transverse sinuses: Normal. Sigmoid sinuses: Normal. Visualized jugular veins: Normal. IMPRESSION: 1. Nonspecific scattered foci of white matter hyperintensity. Though this may be seen in the setting of migraine headaches or vasculopathy such as early chronic small vessel disease, it is also seen in normal patients of this age group. 2. Normal intracranial MRA/MRV. Electronically Signed   By: Deatra Robinson M.D.   On: 07/12/2018 00:37      Roxy Horseman, PA-C 07/12/18 0100    Azalia Bilis, MD 07/13/18 [phone]

## 2018-07-15 ENCOUNTER — Ambulatory Visit (INDEPENDENT_AMBULATORY_CARE_PROVIDER_SITE_OTHER): Payer: Managed Care, Other (non HMO) | Admitting: Neurology

## 2018-07-15 ENCOUNTER — Encounter: Payer: Self-pay | Admitting: Neurology

## 2018-07-15 VITALS — BP 114/71 | HR 82 | Ht 62.0 in | Wt 129.0 lb

## 2018-07-15 DIAGNOSIS — G43001 Migraine without aura, not intractable, with status migrainosus: Secondary | ICD-10-CM

## 2018-07-15 MED ORDER — METHYLPREDNISOLONE 4 MG PO TBPK: ORAL_TABLET | ORAL | 1 refills | Status: DC

## 2018-07-15 MED ORDER — METHYLPREDNISOLONE ACETATE 80 MG/ML IJ SUSP
80.0000 mg | Freq: Once | INTRAMUSCULAR | Status: AC
  Administered 2018-07-15: 80 mg via INTRAMUSCULAR

## 2018-07-15 NOTE — Progress Notes (Signed)
Nerve block w/o steroid: Pt signed consent  0.5% Bupivocaine 6 mL LOT: [id number] EXP: 09/2019 NDC: 40981-191-47  2% Lidocaine 6 mL LOT: [id number] EXP: 04/30/2019 NDC: 8295-6213-08  Depomedrol 80 mg/ 1 mL IM injection See MAR

## 2018-07-15 NOTE — Progress Notes (Signed)
[facility]    Provider:  Dr Lucia Gaskins Referring Provider: Roxy Horseman PA-C Primary Care Physician:  Georga Kaufmann, MD  CC:  headache  HPI:  Christina Nixon is a 47 y.o. female here as requested by Dr. Judee Clara for migraine. She does not have a history of significant headaches. Started on Wednesday October 2nd in the frontal area, pounding,throbbing, continuous, no light sensitivity no sound sensitivity, +nausea, ear ringing. She thought it was sinusitis and started on pseudephedrine and nasal spray. Tylenol didn't help. She had scalp sensitivity. On October 5th she went to a festival and took meloxicam, it was hard for her to sleep and she took a sleep aid to see if it would help and it did not. She tried goody powder on the 8th. She has tried multiple things, she went to [facility], worse with movement, better with sitting still. On the 7th she feels it went to a 7/10, pounding, checked blood pressure, Toradol did not help. She is very active, has tried multiple other options including BC powders, sumatriptan took it on 10/9 did not help and took a second dose. It became a 10/10, it made her cry, severe pain. On October 10th she felt well but it came back in the afternoon. She stayed home on the 11th took 2 alleve no help. A massage did not help. She saw her pcp on the 11th again she took 100mg  sumatriptan, 2 alleve then took 2 more sumatriptan (50mg  pill). Saturday the 12th she felt a little better. She tried multiple other things. Her sister has migraines. She went to the ER on Saturday. On the 13th she had treatment in the ED and she felt better but came back the next day.  She started Topiramate 14th. The headache came back the 15th, Has been an 8. Yesterday was a 2-3 in the morning and increased throughout the day to an 8/9. Right now   Reviewed notes, labs and personally reviewed imaging from outside physicians, which showed:  tsh nml   MRI/MRA/MRV  07/11/2018: 1. Nonspecific scattered foci of white matter hyperintensity. Though this may be seen in the setting of migraine headaches or vasculopathy such as early chronic small vessel disease, it is also seen in normal patients of this age group. 2. Normal intracranial MRA/MRV.  Review of Systems: Patient complains of symptoms per HPI as well as the following symptoms: headache. Pertinent negatives and positives per HPI. All others negative.   Social History   Socioeconomic History  . Marital status: Single    Spouse name: Not on file  . Number of children: 0  . Years of education: Not on file  . Highest education level: Bachelor's degree (e.g., BA, AB, BS)  Occupational History  . Not on file  Social Needs  . Financial resource strain: Not on file  . Food insecurity:    Worry: Not on file    Inability: Not on file  . Transportation needs:    Medical: Not on file    Non-medical: Not on file  Tobacco Use  . Smoking status: Never Smoker  . Smokeless tobacco: Never Used  Substance and Sexual Activity  . Alcohol use: Yes    Alcohol/week: 14.0 standard drinks    Types: 14 Cans of beer per week    Comment: daily  . Drug use: Never  . Sexual activity: Not on file  Lifestyle  . Physical activity:    Days per week: Not on file  Minutes per session: Not on file  . Stress: Not on file  Relationships  . Social connections:    Talks on phone: Not on file    Gets together: Not on file    Attends religious service: Not on file    Active member of club or organization: Not on file    Attends meetings of clubs or organizations: Not on file    Relationship status: Not on file  . Intimate partner violence:    Fear of current or ex partner: Not on file    Emotionally abused: Not on file    Physically abused: Not on file    Forced sexual activity: Not on file  Other Topics Concern  . Not on file  Social History Narrative   Lives at home alone    Right handed   Caffeine: 2  cups daily    Family History  Problem Relation Age of Onset  . COPD Mother   . Arthritis Mother   . Other Mother        gastric dumping syndrome   . Kidney disease Mother        stage 3  . High Cholesterol Mother   . Lung cancer Father   . Cancer Sister        external female cancer   . Diabetes Sister   . Arthritis Sister   . Other Sister        gastric dumping syndrome   . High blood pressure Sister   . Migraines Sister     Past Medical History:  Diagnosis Date  . Migraine     Past Surgical History:  Procedure Laterality Date  . THYROIDECTOMY, PARTIAL  1997    Current Outpatient Medications  Medication Sig Dispense Refill  . amphetamine-dextroamphetamine (ADDERALL) 20 MG tablet Take 40 mg by mouth daily.    . cycloSPORINE (RESTASIS OP) Apply to eye.    . levonorgestrel (MIRENA) 20 MCG/24HR IUD 1 each by Intrauterine route once.    . meloxicam (MOBIC) 15 MG tablet TAKE 1 TABLET BY MOUTH EVERY DAY (Patient taking differently: Take 15 mg by mouth daily as needed. ) 90 tablet 0  . prochlorperazine (COMPAZINE) 10 MG tablet Take 1 tablet (10 mg total) by mouth every 8 (eight) hours as needed (Headache). 10 tablet 0  . SUMAtriptan (IMITREX) 50 MG tablet Take 50 mg by mouth every 2 (two) hours as needed for migraine. May repeat in 2 hours if headache persists or recurs.    . valACYclovir (VALTREX) 1000 MG tablet Take 500 mg by mouth daily.    . frovatriptan (FROVA) 2.5 MG tablet Take twice daily for 3 days for refractory migraine 9 tablet 3  . methylPREDNISolone (MEDROL DOSEPAK) 4 MG TBPK tablet Take pill sin the morning with food for 6 days 21 tablet 1  . NONFORMULARY OR COMPOUNDED ITEM [facility]:  Combination Pain Cream - Baclofen 2%, Doxepin 5%, Gabapentin 6%, Topiramate 2%, Pentoxifylline 3%, Ketamine 10%, apply 1-2 grams to affected areas 3-4 times a day. (Patient not taking: Reported on 07/15/2018) 120 each 2   Current Facility-Administered Medications    Medication Dose Route Frequency Provider Last Rate Last Dose  . triamcinolone acetonide (KENALOG) 10 MG/ML injection 10 mg  10 mg Other Once Asencion Islam, DPM      . triamcinolone acetonide (KENALOG-40) injection 20 mg  20 mg Other Once Asencion Islam, DPM      . triamcinolone acetonide (KENALOG-40) injection 20 mg  20 mg Other  Once Asencion Islam, DPM        Allergies as of 07/15/2018  . (Not on File)    Vitals: BP 114/71 (BP Location: Right Arm, Patient Position: Sitting)   Pulse 82   Ht 5\' 2"  (1.575 m)   Wt 129 lb (58.5 kg)   BMI 23.59 kg/m  Last Weight:  Wt Readings from Last 1 Encounters:  07/15/18 129 lb (58.5 kg)   Last Height:   Ht Readings from Last 1 Encounters:  07/15/18 5\' 2"  (1.575 m)   Physical exam: Exam: Gen: NAD, conversant, well nourised, well groomed                     CV: RRR, no MRG. No Carotid Bruits. No peripheral edema, warm, nontender Eyes: Conjunctivae clear without exudates or hemorrhage  Neuro: Detailed Neurologic Exam  Speech:    Speech is normal; fluent and spontaneous with normal comprehension.  Cognition:    The patient is oriented to person, place, and time;     recent and remote memory intact;     language fluent;     normal attention, concentration,     fund of knowledge Cranial Nerves:    The pupils are equal, round, and reactive to light. The fundi are normal and spontaneous venous pulsations are present. Visual fields are full to finger confrontation. Extraocular movements are intact. Trigeminal sensation is intact and the muscles of mastication are normal. The face is symmetric. The palate elevates in the midline. Hearing intact. Voice is normal. Shoulder shrug is normal. The tongue has normal motion without fasciculations.   Coordination:    Normal finger to nose and heel to shin. Normal rapid alternating movements.   Gait:    Heel-toe and tandem gait are normal.   Motor Observation:    No asymmetry, no atrophy, and  no involuntary movements noted. Tone:    Normal muscle tone.    Posture:    Posture is normal. normal erect    Strength:    Strength is V/V in the upper and lower limbs.      Sensation: intact to LT     Reflex Exam:  DTR's:    Deep tendon reflexes in the upper and lower extremities are normal bilaterally.   Toes:    The toes are downgoing bilaterally.   Clonus:    Clonus is absent.       Assessment/Plan:  Patient with status migrainosus. She has been to multiple urgent cares and physicians. Imaging all normal. Will try break today with nerve blocks.  - Nerve blocks reduced pain to 0 - steroid taper to prevent rebound - if headache returns, Frova BID x 3 days and imitrex prn  Performed by Dr. Barry Dienes.D.All procedures a documented blood were medically necessary, reasonable and appropriate based on the patient's history, medical diagnosis and physician opinion. Verbal informed consent was obtained from the patient, patient was informed of potential risk of procedure, including bruising, bleeding, hematoma formation, infection, muscle weakness, muscle pain, numbness, transient hypertension, transient hyperglycemia and transient insomnia among others. All areas injected were topically clean with isopropyl rubbing alcohol. Nonsterile nonlatex gloves were worn during the procedure.  1. Greater occipital nerve block [phone]). The greater occipital nerve site was identified at the nuchal line medial to the occipital artery. Medication was injected into the left and right occipital nerve areas and suboccipital areas. Patient's condition is associated with inflammation of the greater occipital nerve and associated multiple groups. Injection was  deemed medically necessary, reasonable and appropriate. Injection represents a separate and unique surgical service.  2. Lesser occipital nerve block [phone]). The lesser occipital nerve site was identified approximately 2 cm lateral to the greater  occipital nerve. Occasion was injected into the left and right occipital nerve areas. Patient's condition is associated with inflammation of the lesser occipital nerve and associated muscle groups. Injection was deemed medically necessary, reasonable and appropriate. Injection represents a separate and unique surgical service.   3. Auriculotemporal nerve block ([id number]): The Auriculotemporal nerve site was identified along the posterior margin of the sternocleidomastoid muscle toward the base of the ear. Medication was injected into the left and right radicular temporal nerve areas. Patient's condition is associated with inflammation of the Auriculotemporal Nerve and associated muscle groups. Injection was deemed medically necessary, reasonable and appropriate. Injection represents a separate and unique surgical service.  4. Supraorbital nerve block ([id number]): Supraorbital nerve site was identified along the incision of the frontal bone on the orbital/supraorbital ridge. Medication was injected into the left and right supraorbital nerve areas. Patient's condition is associated with inflammation of the supraorbital and associated muscle groups. Injection was deemed medically necessary, reasonable and appropriate. Injection represents a separate and unique surgical service.    Meds ordered this encounter  Medications  . methylPREDNISolone (MEDROL DOSEPAK) 4 MG TBPK tablet    Sig: Take pill sin the morning with food for 6 days    Dispense:  21 tablet    Refill:  1  . methylPREDNISolone acetate (DEPO-MEDROL) injection 80 mg    Discussed: To prevent or relieve headaches, try the following: Cool Compress. Lie down and place a cool compress on your head.  Avoid headache triggers. If certain foods or odors seem to have triggered your migraines in the past, avoid them. A headache diary might help you identify triggers.  Include physical activity in your daily routine. Try a daily walk or other moderate  aerobic exercise.  Manage stress. Find healthy ways to cope with the stressors, such as delegating tasks on your to-do list.  Practice relaxation techniques. Try deep breathing, yoga, massage and visualization.  Eat regularly. Eating regularly scheduled meals and maintaining a healthy diet might help prevent headaches. Also, drink plenty of fluids.  Follow a regular sleep schedule. Sleep deprivation might contribute to headaches Consider biofeedback. With this mind-body technique, you learn to control certain bodily functions - such as muscle tension, heart rate and blood pressure - to prevent headaches or reduce headache pain.    Proceed to emergency room if you experience new or worsening symptoms or symptoms do not resolve, if you have new neurologic symptoms or if headache is severe, or for any concerning symptom.   Provided education and documentation from American headache Society toolbox including articles on: chronic migraine medication overuse headache, chronic migraines, prevention of migraines, behavioral and other nonpharmacologic treatments for headache.   A total of 100 minutes was spent face-to-face with this patient. Over half this time was spent on counseling patient on the  1. Migraine without aura and with status migrainosus, not intractable     diagnosis and different diagnostic and therapeutic options, counseling and coordination of care, risks ans benefits of management, compliance, or risk factor reduction and education.     Naomie Dean, MD  [facility] [address] [place], [place] [address]  Phone [phone] Fax [phone]

## 2018-07-15 NOTE — Patient Instructions (Addendum)
Take pills daily in the morning with food If headache returns take zembrace injection email later this evening with updates Call first thing at 8am if the headache returns in full force tomorrow morning   Methylprednisolone tablets What is this medicine? METHYLPREDNISOLONE (meth ill pred NISS oh lone) is a corticosteroid. It is commonly used to treat inflammation of the skin, joints, lungs, and other organs. Common conditions treated include asthma, allergies, and arthritis. It is also used for other conditions, such as blood disorders and diseases of the adrenal glands. This medicine may be used for other purposes; ask your health care provider or pharmacist if you have questions. COMMON BRAND NAME(S): Medrol, Medrol Dosepak What should I tell my health care provider before I take this medicine? They need to know if you have any of these conditions: -Cushing's syndrome -eye disease, vision problems -diabetes -glaucoma -heart disease -high blood pressure -infection (especially a virus infection such as chickenpox, cold sores, or herpes) -liver disease -mental illness -myasthenia gravis -osteoporosis -recently received or scheduled to receive a vaccine -seizures -stomach or intestine problems -thyroid disease -an unusual or allergic reaction to lactose, methylprednisolone, other medicines, foods, dyes, or preservatives -pregnant or trying to get pregnant -breast-feeding How should I use this medicine? Take this medicine by mouth with a glass of water. Follow the directions on the prescription label. Take this medicine with food. If you are taking this medicine once a day, take it in the morning. Do not take it more often than directed. Do not suddenly stop taking your medicine because you may develop a severe reaction. Your doctor will tell you how much medicine to take. If your doctor wants you to stop the medicine, the dose may be slowly lowered over time to avoid any side  effects. Talk to your pediatrician regarding the use of this medicine in children. Special care may be needed. Overdosage: If you think you have taken too much of this medicine contact a poison control center or emergency room at once. NOTE: This medicine is only for you. Do not share this medicine with others. What if I miss a dose? If you miss a dose, take it as soon as you can. If it is almost time for your next dose, talk to your doctor or health care professional. You may need to miss a dose or take an extra dose. Do not take double or extra doses without advice. What may interact with this medicine? Do not take this medicine with any of the following medications: -alefacept -echinacea -live virus vaccines -metyrapone -mifepristone This medicine may also interact with the following medications: -amphotericin B -aspirin and aspirin-like medicines -certain antibiotics like erythromycin, clarithromycin, troleandomycin -certain medicines for diabetes -certain medicines for fungal infections like ketoconazole -certain medicines for seizures like carbamazepine, phenobarbital, phenytoin -certain medicines that treat or prevent blood clots like warfarin -cholestyramine -cyclosporine -digoxin -diuretics -female hormones, like estrogens and birth control pills -isoniazid -NSAIDs, medicines for pain inflammation, like ibuprofen or naproxen -other medicines for myasthenia gravis -rifampin -vaccines This list may not describe all possible interactions. Give your health care provider a list of all the medicines, herbs, non-prescription drugs, or dietary supplements you use. Also tell them if you smoke, drink alcohol, or use illegal drugs. Some items may interact with your medicine. What should I watch for while using this medicine? Tell your doctor or healthcare professional if your symptoms do not start to get better or if they get worse. Do not stop taking except on your  doctor's advice.  You may develop a severe reaction. Your doctor will tell you how much medicine to take. This medicine may increase your risk of getting an infection. Tell your doctor or health care professional if you are around anyone with measles or chickenpox, or if you develop sores or blisters that do not heal properly. This medicine may affect blood sugar levels. If you have diabetes, check with your doctor or health care professional before you change your diet or the dose of your diabetic medicine. Tell your doctor or health care professional right away if you have any change in your eyesight. Using this medicine for a long time may increase your risk of low bone mass. Talk to your doctor about bone health. What side effects may I notice from receiving this medicine? Side effects that you should report to your doctor or health care professional as soon as possible: -allergic reactions like skin rash, itching or hives, swelling of the face, lips, or tongue -bloody or tarry stools -changes in vision -hallucination, loss of contact with reality -muscle cramps -muscle pain -palpitations -signs and symptoms of high blood sugar such as dizziness; dry mouth; dry skin; fruity breath; nausea; stomach pain; increased hunger or thirst; increased urination -signs and symptoms of infection like fever or chills; cough; sore throat; pain or trouble passing urine -trouble passing urine or change in the amount of urine Side effects that usually do not require medical attention (report to your doctor or health care professional if they continue or are bothersome): -changes in emotions or mood -constipation -diarrhea -excessive hair growth on the face or body -headache -nausea, vomiting -trouble sleeping -weight gain This list may not describe all possible side effects. Call your doctor for medical advice about side effects. You may report side effects to FDA at 1-800-FDA-1088. Where should I keep my medicine? Keep  out of the reach of children. Store at room temperature between 20 and 25 degrees C (68 and 77 degrees F). Throw away any unused medicine after the expiration date. NOTE: This sheet is a summary. It may not cover all possible information. If you have questions about this medicine, talk to your doctor, pharmacist, or health care provider.  2018 Elsevier/Gold Standard (2015-11-22 15:53:30)

## 2018-07-16 ENCOUNTER — Telehealth: Payer: Self-pay | Admitting: Neurology

## 2018-07-16 ENCOUNTER — Other Ambulatory Visit: Payer: Self-pay | Admitting: Neurology

## 2018-07-16 MED ORDER — FROVATRIPTAN SUCCINATE 2.5 MG PO TABS: ORAL_TABLET | ORAL | 3 refills | Status: DC

## 2018-07-16 NOTE — Telephone Encounter (Signed)
Called the patient back and asked if she was taking the steroid dose pack. Pt verbalized that she has It and took it this morning. Patient states that she got the zembrace injection yesterday. She states that she does not have a headache but in the event she dose get one over the weekend she just wanted to know what to do. She only has 1 imitrex left and can't get that refilled until 11/7 due to insurance. Advised the patient that the hope is the steroid is going to keep the headaches at bay. The patient just wanted to have a plan in place on what Dr Lucia Gaskins would recommend if in the event it did come back over the weekend.

## 2018-07-16 NOTE — Progress Notes (Signed)
frova

## 2018-07-16 NOTE — Telephone Encounter (Signed)
Patient called and stated that she was supposed to send Dr. Lucia Gaskins an email letting her know how she was feeling yesterday after she left the office but when she attempted to send the email her mychart would not set up with the activation code. So she had to put a request in and it will take 24 hours. She did not want Dr. Lucia Gaskins to think she was noncompliant. She wants Dr. Lucia Gaskins to know that she does not have a headache this morning, she is worried that one may come on once she gets to work. She would just like to speak with someone regarding what she should do if a migraine comes on over the weekend so that she has "plan". Please call and advise.

## 2018-07-16 NOTE — Telephone Encounter (Signed)
Called the patient and made her aware that Dr Lucia Gaskins agreed to use the options that she already mentioned and that she has on hand. If in the event those dont work she called the patient in Frova a medication they discussed in the office visit yesterday. Advised the patient that it may be pricey but again this if there if the patient only needs it. Pt verbalized understanding and was appreciative for the call and following up.

## 2018-07-19 ENCOUNTER — Encounter: Payer: Self-pay | Admitting: Neurology

## 2018-07-19 DIAGNOSIS — G43709 Chronic migraine without aura, not intractable, without status migrainosus: Secondary | ICD-10-CM | POA: Insufficient documentation

## 2018-07-19 DIAGNOSIS — G43001 Migraine without aura, not intractable, with status migrainosus: Secondary | ICD-10-CM | POA: Insufficient documentation

## 2018-07-20 ENCOUNTER — Encounter: Payer: Self-pay | Admitting: *Deleted

## 2018-07-20 ENCOUNTER — Telehealth: Payer: Self-pay | Admitting: Neurology

## 2018-07-20 NOTE — Telephone Encounter (Signed)
Patient has been having intermittent headaches since last Friday. She took frovatriptan (FROVA) 2.5 MG tablet, methylPREDNISolone (MEDROL DOSEPAK) 4 MG TBPK tablet and Topamax this morning but still has a headache. Please call and discuss.

## 2018-07-20 NOTE — Progress Notes (Signed)
Added topamax that pt was prescribed by Dr. Judee Clara.

## 2018-07-20 NOTE — Telephone Encounter (Signed)
Spoke with patient. She stated that she Friday she felt ok in the morning but felt sick later. Took Compazine, Aleve, Benadryl for headache 8/10 but didn't really help. Saturday, Sunday and Monday AM she was better. Developed migraine yesterday with dizziness but eased off when she got home from work. Today she started Frovatriptan. She has one day left of the medrol dose pack and today she will increase to 50 mg of Topiramate from 25 mg. She thinks the computer screens are causing the headaches. She ordered a new screen dimmer which she hopes will be of help. She stated that she thinks the Frovatriptan is helping and she feels better. Advised patient to go ahead and plan to take the Frovatriptan twice daily as prescribed for 3 days. Can take 12 hours apart. Advised to also finish steroid dose pack and that Dr. Lucia Gaskins said she can take her Topamax 50 mg at night rather than 25 mg twice daily. Patient verbalized understanding and appreciation. She will call back if she doesn't continue to improve.   Dr. Lucia Gaskins aware.

## 2018-07-26 ENCOUNTER — Telehealth: Payer: Self-pay | Admitting: Neurology

## 2018-07-26 NOTE — Telephone Encounter (Signed)
Patient called and wanted to give an update to Dr. Lucia Gaskins. The patient had a headache every day last week even taking her new medication. Patient would like to try something different and she wants to try something new before the end of the year. She wants to go about this "more aggressively". She wants a change in medication "ASAP". Please call and advise.

## 2018-07-27 ENCOUNTER — Other Ambulatory Visit: Payer: Self-pay | Admitting: Neurology

## 2018-07-27 MED ORDER — PROPRANOLOL HCL 10 MG PO TABS: 10.0000 mg | ORAL_TABLET | Freq: Three times a day (TID) | ORAL | 6 refills | Status: DC

## 2018-07-27 NOTE — Telephone Encounter (Signed)
Per Dr. Lucia Gaskins, increase Topamax to 100 mg qhs. Can do nerve block if needed.

## 2018-07-27 NOTE — Telephone Encounter (Addendum)
Spoke with patient. She stated that last week she took Frovatriptan BID for 2 days, skipped the third day b/c she had no headache however developed the evening of the third day. She took Imitrex two days in a row and it helped. She has still had a headache nearly every day. Today she had a headache 3/10. She started the Frovatriptan again and plans to take it BID for 3 days. She has asked about "going for the big guns" (I.e. Injections) as she is concerned about not only her deductible but "knocking out these headaches". RN advised that for now we are letting the Topamax take effect. It can take 4-6 weeks to start really working and in the meantime we have increased last week to 50 mg and now Dr. Lucia Gaskins has said to increase to 100 mg daily at bedtime. Also offered nerve block today if needed. Pt verbalized understanding. She stated that the nerve block helped last time but it wore off quickly so pt decided against that today. She is aware that if needed in the future we can do them. She stated that she was able to setup her mychart account and she will send Dr. Lucia Gaskins a message. Pt was encouraged to do this. RN discussed medication overuse with patient and pt also advised not to take Frovatriptan and Sumatriptan on the same day. Pt stated that she feels like she needs to be more patient and take it one day at a time. Pt verbalized appreciation for the call and had no further questions.

## 2018-07-29 NOTE — Telephone Encounter (Signed)
I can give her 3 days a month but if she owuld like a continuous period she has to see her pcp to manage this. thanks

## 2018-07-29 NOTE — Telephone Encounter (Signed)
Rn call patient about what type of leave is she trying to take. Pt stated Dr .Lucia Gaskins told her in [place] message that if she needed long term leave or intermittent leave to speak with her PCP. Pt wanted to find out if she can intermittent days for headaches. She will be seeing her PCP next week to discuss leave options. PT stated she feels like with her headaches she needs one to two weeks off to get better.Rn stated per DR. Lucia Gaskins note she can only give 3 days per month for headaches when they occur. RN stated if she would like 3 days she can have her job fax the [employer] forms here to medical records. Rn stated to pt our office does charge for any disability or FMLA forms. PT stated she will discuss with her PCP on what options are available to her. Pt will call back if she is willing to be allowed 3 days per month. PT verbalized understanding.

## 2018-07-29 NOTE — Telephone Encounter (Signed)
Pt requesting a call stating she would like to discuss going on leave from work on [employer]. Before talking with HR she would like to discuss with Dr. Lucia Gaskins prior. Please advise

## 2018-08-04 ENCOUNTER — Telehealth: Payer: Self-pay | Admitting: Neurology

## 2018-08-04 ENCOUNTER — Other Ambulatory Visit: Payer: Self-pay | Admitting: Neurology

## 2018-08-04 MED ORDER — VENLAFAXINE HCL ER 37.5 MG PO CP24: 37.5000 mg | ORAL_CAPSULE | Freq: Every day | ORAL | 11 refills | Status: DC

## 2018-08-04 NOTE — Telephone Encounter (Signed)
Pt has called to inform that the propranolol  has not helped with her headaches and she would like to move forward with the injection that Dr Lucia Gaskins mentioned to her via My Chart.  Pt has asked to be able to come in to be seen for her continuous headaches (which are causing her to miss a lot of time from work).  Pt has accepted a f/u with NP Eber Jones for 08-05-2018 and is hoping that Dr Lucia Gaskins will speak with NP Eber Jones re: what the next step should be for treatment.  Pt is asking for a call, she states although she has called she will also my chart message Dr Lucia Gaskins

## 2018-08-04 NOTE — Telephone Encounter (Signed)
LVM informing the patient this RN will be working with Enid Skeens tomorrow as well as another Engineer, civil (consulting). Advised her Dr Lucia Gaskins will be here should NP need to speak with her. Advised she will be well taken care of. Left office number for call back if needed.

## 2018-08-04 NOTE — Progress Notes (Signed)
[facility]  PATIENT: Christina Nixon DOB: [birth year]   REASON FOR VISIT: Follow-up for constant headache HISTORY FROM:patient    HISTORY OF PRESENT ILLNESS:UPDATE 11/7/2019CM Ms. Christina Nixon, 47 year old female returns for follow-up with migraine headaches.  She has failed nerve blocks.  She has failed propanolol.  She is currently on Topamax dose was increased recently to 100 mg at night.  She was started on Effexor yesterday for her significant anxiety and stress however she has not picked up the prescription.  She is here today to learn how to inject herself with Aimovig as a preventive.  Patient made aware this is not an instant fix  and she may result see results in 3 to 6 months.  In addition with her Effexor she is advised to see a counselor and a psychiatrist as her additional stress and anxiety are comorbidities for effective treatment of her migraines.  She returns for reevaluation   07/15/18 AAAnn Najwa Nixon is a 47 y.o. female here as requested by Dr. Judee Clara for migraine. She does not have a history of significant headaches. Started on Wednesday October 2nd in the frontal area, pounding,throbbing, continuous, no light sensitivity no sound sensitivity, +nausea, ear ringing. She thought it was sinusitis and started on pseudephedrine and nasal spray. Tylenol didn't help. She had scalp sensitivity. On October 5th she went to a festival and took meloxicam, it was hard for her to sleep and she took a sleep aid to see if it would help and it did not. She tried goody powder on the 8th. She has tried multiple things, she went to [facility], worse with movement, better with sitting still. On the 7th she feels it went to a 7/10, pounding, checked blood pressure, Toradol did not help. She is very active, has tried multiple other options including BC powders, sumatriptan took it on 10/9 did not help and took a second dose. It became a 10/10, it made her cry, severe  pain. On October 10th she felt well but it came back in the afternoon. She stayed home on the 11th took 2 alleve no help. A massage did not help. She saw her pcp on the 11th again she took 100mg  sumatriptan, 2 alleve then took 2 more sumatriptan (50mg  pill). Saturday the 12th she felt a little better. She tried multiple other things. Her sister has migraines. She went to the ER on Saturday. On the 13th she had treatment in the ED and she felt better but came back the next day.  She started Topiramate 14th. The headache came back the 15th, Has been an 8. Yesterday was a 2-3 in the morning and increased throughout the day to an 8/9. Right now    REVIEW OF SYSTEMS: Full 14 system review of systems performed and notable only for those listed, all others are neg:  Constitutional: Fatigue Cardiovascular: neg Ear/Nose/Throat: neg  Skin: neg Eyes: neg Respiratory: neg Gastroitestinal: neg  Hematology/Lymphatic: neg  Endocrine: neg Musculoskeletal:neg Allergy/Immunology: neg Neurological: Headaches Psychiatric: Stress and anxiety Sleep : neg   ALLERGIES: No Known Allergies  HOME MEDICATIONS: Outpatient Medications Prior to Visit  Medication Sig Dispense Refill  . amphetamine-dextroamphetamine (ADDERALL) 20 MG tablet Take 40 mg by mouth daily.    . cycloSPORINE (RESTASIS OP) Apply to eye.    . frovatriptan (FROVA) 2.5 MG tablet Take twice daily for 3 days for refractory migraine 9 tablet 3  . levonorgestrel (MIRENA) 20 MCG/24HR IUD 1 each by Intrauterine route once.    Marland Kitchen  meloxicam (MOBIC) 15 MG tablet TAKE 1 TABLET BY MOUTH EVERY DAY (Patient taking differently: Take 15 mg by mouth daily as needed. ) 90 tablet 0  . prochlorperazine (COMPAZINE) 10 MG tablet Take 1 tablet (10 mg total) by mouth every 8 (eight) hours as needed (Headache). 10 tablet 0  . SUMAtriptan (IMITREX) 50 MG tablet Take 50 mg by mouth every 2 (two) hours as needed for migraine. May repeat in 2 hours if headache persists or  recurs.    . topiramate (TOPAMAX) 25 MG tablet Take 50 mg by mouth at bedtime.    . valACYclovir (VALTREX) 1000 MG tablet Take 500 mg by mouth daily.    Marland Kitchen venlafaxine XR (EFFEXOR XR) 37.5 MG 24 hr capsule Take 1 capsule (37.5 mg total) by mouth daily with breakfast. 30 capsule 11  . methylPREDNISolone (MEDROL DOSEPAK) 4 MG TBPK tablet Take pill sin the morning with food for 6 days (Patient not taking: Reported on 08/05/2018) 21 tablet 1  . NONFORMULARY OR COMPOUNDED ITEM [facility]:  Combination Pain Cream - Baclofen 2%, Doxepin 5%, Gabapentin 6%, Topiramate 2%, Pentoxifylline 3%, Ketamine 10%, apply 1-2 grams to affected areas 3-4 times a day. (Patient not taking: Reported on 07/15/2018) 120 each 2   Facility-Administered Medications Prior to Visit  Medication Dose Route Frequency Provider Last Rate Last Dose  . triamcinolone acetonide (KENALOG) 10 MG/ML injection 10 mg  10 mg Other Once [place], Titorya, DPM      . triamcinolone acetonide (KENALOG-40) injection 20 mg  20 mg Other Once [place], Titorya, DPM      . triamcinolone acetonide (KENALOG-40) injection 20 mg  20 mg Other Once Asencion Islam, DPM        PAST MEDICAL HISTORY: Past Medical History:  Diagnosis Date  . Migraine     PAST SURGICAL HISTORY: Past Surgical History:  Procedure Laterality Date  . THYROIDECTOMY, PARTIAL  1997    FAMILY HISTORY: Family History  Problem Relation Age of Onset  . COPD Mother   . Arthritis Mother   . Other Mother        gastric dumping syndrome   . Kidney disease Mother        stage 3  . High Cholesterol Mother   . Lung cancer Father   . Cancer Sister        external female cancer   . Diabetes Sister   . Arthritis Sister   . Other Sister        gastric dumping syndrome   . High blood pressure Sister   . Migraines Sister     SOCIAL HISTORY: Social History   Socioeconomic History  . Marital status: Single    Spouse name: Not on file  . Number of children: 0  . Years of  education: Not on file  . Highest education level: Bachelor's degree (e.g., BA, AB, BS)  Occupational History  . Not on file  Social Needs  . Financial resource strain: Not on file  . Food insecurity:    Worry: Not on file    Inability: Not on file  . Transportation needs:    Medical: Not on file    Non-medical: Not on file  Tobacco Use  . Smoking status: Never Smoker  . Smokeless tobacco: Never Used  Substance and Sexual Activity  . Alcohol use: Yes    Alcohol/week: 14.0 standard drinks    Types: 14 Cans of beer per week    Comment: daily  . Drug use: Never  .  Sexual activity: Not on file  Lifestyle  . Physical activity:    Days per week: Not on file    Minutes per session: Not on file  . Stress: Not on file  Relationships  . Social connections:    Talks on phone: Not on file    Gets together: Not on file    Attends religious service: Not on file    Active member of club or organization: Not on file    Attends meetings of clubs or organizations: Not on file    Relationship status: Not on file  . Intimate partner violence:    Fear of current or ex partner: Not on file    Emotionally abused: Not on file    Physically abused: Not on file    Forced sexual activity: Not on file  Other Topics Concern  . Not on file  Social History Narrative   Lives at home alone    Right handed   Caffeine: 2 cups daily     PHYSICAL EXAM  Vitals:   08/05/18 1438  BP: 105/65  Pulse: 87  Weight: 124 lb 9.6 oz (56.5 kg)  Height: 5\' 2"  (1.575 m)   Body mass index is 22.79 kg/m.  Generalized: Well developed, in no acute distress  Head: normocephalic and atraumatic,. Oropharynx benign  Neck: Supple,  Musculoskeletal: No deformity   Neurological examination   Mentation: Alert oriented to time, place, history taking. Attention span and concentration appropriate. Recent and remote memory intact.  Follows all commands speech and language fluent.   Cranial nerve II-XII: Pupils  were equal round reactive to light extraocular movements were full, visual field were full on confrontational test. Facial sensation and strength were normal. hearing was intact to finger rubbing bilaterally. Uvula tongue midline. head turning and shoulder shrug were normal and symmetric.Tongue protrusion into cheek strength was normal. Motor: normal bulk and tone, full strength in the BUE, BLE,  Sensory: normal and symmetric to light touch, Coordination: finger-nose-finger, heel-to-shin bilaterally, no dysmetria Reflexes: Symmetric upper and lower, plantar responses were flexor bilaterally. Gait and Station: Rising up from seated position without assistance, normal stance,  moderate stride, good arm swing, smooth turning, able to perform tiptoe, and heel walking without difficulty. Tandem gait is steady  DIAGNOSTIC DATA (LABS, IMAGING, TESTING) -    ASSESSMENT AND PLAN  47 y.o. year old female  has a past medical history of Migraine. here to follow-up for headache Patient has failed nerve blocks propanolol.  Imaging has  all returned normal. (MRI MRA and MRV )   Continue Topamax 100 mg at bedtime we will refill Continue Imitrex or Frova  as needed as needed for acute headache Patient was just started on Effexor 37.5 mg has not picked up the Rx yet Patient will start Aimovig 140 mg daily, first dose to be given in the office today Patient was asked to see a counselor and psychiatrist for her stress and anxiety disorder Follow up prn Nilda Riggs, [facility], [facility], APRN  [facility] [address] [place], [place] [address] [phone]

## 2018-08-05 ENCOUNTER — Ambulatory Visit (INDEPENDENT_AMBULATORY_CARE_PROVIDER_SITE_OTHER): Payer: Managed Care, Other (non HMO) | Admitting: Nurse Practitioner

## 2018-08-05 ENCOUNTER — Encounter: Payer: Self-pay | Admitting: Nurse Practitioner

## 2018-08-05 VITALS — BP 105/65 | HR 87 | Ht 62.0 in | Wt 124.6 lb

## 2018-08-05 DIAGNOSIS — G43001 Migraine without aura, not intractable, with status migrainosus: Secondary | ICD-10-CM | POA: Diagnosis not present

## 2018-08-05 MED ORDER — ERENUMAB-AOOE 140 MG/ML ~~LOC~~ SOAJ: 140.0000 mg | SUBCUTANEOUS | 8 refills | Status: DC

## 2018-08-05 MED ORDER — TOPIRAMATE 100 MG PO TABS: 100.0000 mg | ORAL_TABLET | Freq: Every day | ORAL | 6 refills | Status: DC

## 2018-08-05 NOTE — Progress Notes (Signed)
Per NP, identified patient by name, DOB. Educated her on Aimovig 70 mg/mL injection technique, and she reviewed correctly. This RN administered Aimovig 70 mg/mL, lot [id number], exp 01/21 into right thigh, bandaid applied. Patient administered second dose into left thigh, same lot and exp date, equaling 140 mg total. This dose was per NP.  Patient tolerated injections well.  This RN gave her Aimovig discount card/information.

## 2018-08-05 NOTE — Patient Instructions (Addendum)
Continue Topamax 100 mg at bedtime we will refill Continue Imitrex and for as needed as needed for acute headache Patient was just started on Effexor 37.5 mg has not picked up the Rx yet Patient will start Aimovig 140 mg daily, first dose to be given in the office today This is given every 30 days Patient was asked to see a counselor and psychiatrist for her stress and anxiety disorder

## 2018-08-09 ENCOUNTER — Telehealth: Payer: Self-pay | Admitting: Nurse Practitioner

## 2018-08-09 MED ORDER — SUMATRIPTAN SUCCINATE 50 MG PO TABS: 50.0000 mg | ORAL_TABLET | ORAL | 3 refills | Status: DC | PRN

## 2018-08-09 NOTE — Addendum Note (Signed)
Addended by: Bertram Savin on: 08/09/2018 03:06 PM   Modules accepted: Orders

## 2018-08-09 NOTE — Telephone Encounter (Signed)
Pt states when she was in office last she failed to mention that she was running low on her SUMAtriptan (IMITREX) 50 MG tablet, she laready has her frovatriptan (FROVA) 2.5 MG tablet.  Pt asking if the SUMAtriptan (IMITREX) 50 MG tablet will be called in for her or something else.  Pt also states she has been dealing with her Blood sugars dropping very low, so much so that she is having to eat something every 3 hours.  Pt is asking for a call to address these concerns

## 2018-08-09 NOTE — Telephone Encounter (Signed)
Spoke with pt. Advised her that Dr. Lucia Gaskins agreed to refill her Sumatriptan for her. Pt aware that she should not be taking Frovatriptan and Sumatriptan on the same day. Informed her that RN spoke with Dr. Lucia Gaskins and we are not aware of any of the medications Dr. Lucia Gaskins currently prescribes her to be the cause of low blood sugar. Advised pt to speak with PCP about this for further evaluation. Pt stated that she is not taking her blood sugar, she just feels that it is low. She says if she doesn't eat she gets sick. Advised that taking some medications on an empty stomach can cause upset stomach. Again advised pt to see PCP about this. Pt verbalized understanding and appreciation.

## 2018-08-11 ENCOUNTER — Telehealth: Payer: Self-pay | Admitting: *Deleted

## 2018-08-11 NOTE — Telephone Encounter (Signed)
Started PA on CMM,  Instructed to call [employer] [phone].  Chronic migraines greater then 15 headaches and 8 being migraines in month.  G43.001.  PA# [id number] approved until 02-07-19. (6months).

## 2018-08-11 NOTE — Progress Notes (Signed)
Made any corrections needed, and agree with history, physical, neuro exam,assessment and plan as stated above.      , MD [facility] 

## 2018-08-11 NOTE — Telephone Encounter (Signed)
LMVM for pharmacy to let them know that aimovig 140mg  PA approval  until 02-07-19.

## 2018-08-12 NOTE — Telephone Encounter (Signed)
Received approval letter from [employer]. Rx approved 08/11/18-02/07/19. Request ID: [id number].

## 2018-08-13 NOTE — Progress Notes (Signed)
Patient ID: Christina Nixon Christina Nixon, female   DOB: October 27, 1970, 47 y.o.   MRN: [id number]   Patient presents for orthotic pick up.  Verbal and written break in and wear instructions given.  Patient will follow up in 4 weeks with Dr Marylene LandStover if symptoms worsen or fail to improve.

## 2018-08-27 ENCOUNTER — Other Ambulatory Visit: Payer: Self-pay | Admitting: Neurology

## 2018-10-04 ENCOUNTER — Telehealth: Payer: Self-pay | Admitting: Neurology

## 2018-10-04 NOTE — Telephone Encounter (Signed)
She also stated that she has changed her antidepressant.

## 2018-10-04 NOTE — Telephone Encounter (Signed)
Patient called and requested to speak with Dr. Lucia Gaskins or her nurse regarding a status update on her condition. She would like to know if the Topamax can be increased because she is still having headaches. She feels that they have gotten better but she would like to increase the Topmax if possible. She will take her 3rd round of Aimovig tomorrow. Please call and advise.

## 2018-10-04 NOTE — Telephone Encounter (Signed)
I spoke with Christina Nixon. She wanted to give Dr. Lucia Gaskins an update on her migraines. Sts. migraines are less frequent, less severe, but she is having daily milder h/a's.  Wants to know if Topamax can be increased beyond the 100mg  qhs she is taking now.  In addition to Topamax, she is also seeing a counselor for her stress/anxiety. She has seen her OB/GYN and sts. hormone levels are normal. She will be taking her 3rd Aimovig injection tomorrow (10/05/18). She started Effexor on 08/06/18. The counselor she is seeing switched her from Effexor to Pristiq on 10/02/18./fim

## 2018-10-05 MED ORDER — TOPIRAMATE 50 MG PO TABS: 50.0000 mg | ORAL_TABLET | Freq: Every day | ORAL | 3 refills | Status: DC

## 2018-10-05 NOTE — Telephone Encounter (Signed)
Yes please increase to 150mg  thank you!

## 2018-10-05 NOTE — Telephone Encounter (Signed)
I spoke with Christina Nixon this am and per Dr. Lucia Gaskins, advised she should increase Topamax to 150mg  qhs. Shikira is agreeable with this. Topamax 50mg  tablets [facility]ed [facility] per [facility] request, and so now she will take Topamax 100mg  and an additional 50mg  tablet qhs/fim

## 2018-10-26 ENCOUNTER — Encounter: Payer: Self-pay | Admitting: *Deleted

## 2018-10-26 ENCOUNTER — Telehealth: Payer: Self-pay | Admitting: Neurology

## 2018-10-26 ENCOUNTER — Other Ambulatory Visit: Payer: Self-pay | Admitting: *Deleted

## 2018-10-26 MED ORDER — METHYLPREDNISOLONE 4 MG PO TBPK: ORAL_TABLET | ORAL | 0 refills | Status: DC

## 2018-10-26 MED ORDER — FREMANEZUMAB-VFRM 225 MG/1.5ML ~~LOC~~ SOSY: 225.0000 mg | PREFILLED_SYRINGE | SUBCUTANEOUS | 11 refills | Status: DC

## 2018-10-26 NOTE — Telephone Encounter (Signed)
Per Dr. Lucia Gaskins, will prescribe a medrol dosepack and patient can also take benadryl otc. Also advise pt to stop the Aimovig, can try Ajovy instead.   I spoke with the patient and offered the medrol dose pack and low dose oral Benadryl. Advised that the Benadryl can cause drowsiness. Advised to stop Aimovig. Confirmed pt has no allergies. Pt denied other allergy symptoms, only reports rash on either side of her eyes, rash on nose, chin (unresponsive to cortisone, vaseline, and polysporin) and has had trouble with constipation. LBM today. She denied any injection site reactions. She said she has been working with a therapist, has changed her effexor to pristiq 100 mg daily and has been out of work on [employer] (by her therapist) since mid November. She returns to work [employer] 2/3. She has identified some triggers (incl food) for her headaches and has had decreased stress.  She took a food sensitivity test. Patient is not opposed to switching to Ajovy but questions whether she will get rashes with this too. The Aimovig did help. She is only having slight headaches. Her next dose is due 2/7 but she knows not to continue the Aimovig. She is interested in Botox at some point if she meets the requirements. I told her that I would call back with Dr. Trevor Mace suggestions regarding next steps.

## 2018-10-26 NOTE — Telephone Encounter (Signed)
I spoke with the patient and discussed that Dr. Lucia GaskinsAhern recommends to switch to Ajovy 225 mg and she can start this 30 days after her last Aimovig. Pt verbalized understanding. We discussed possible s/e of injection site reaction and the administration of the medication. Pt aware that constipation is not a known s/e of Ajovy. She is aware that the medication will come with detailed instructions and it is to be refrigerated before use like the Aimovig. Pt aware not to recap the syringe after use and to place it in the sharps bin immediately after use. I advised pt to call our office if she notices any reactions after starting the Ajovy or if she has any questions. She verbalized understanding and appreciation.

## 2018-10-26 NOTE — Progress Notes (Signed)
See other phone note. Pt agreed to medrol dosepack and had no questions. She has taken this before. She is aware that she can take each day's pills in the mornings with food rather than taking them throughout each day.

## 2018-10-26 NOTE — Telephone Encounter (Signed)
Try Ajovy, wait 30 days after last Aimovig injection before starting thanks!

## 2018-10-26 NOTE — Telephone Encounter (Signed)
Ajovy ordered and Aimovig d/c'd.

## 2018-10-26 NOTE — Telephone Encounter (Signed)
Pt states she has been taking her shots of her Erenumab-aooe (AIMOVIG) 140 MG/ML SOAJ Her last one was Jan 7th and she states she normally does get rashes from it but it goes away within two weeks but this last shot the rash is worse, its all around her eyes and nose also on her chin and its very itchy. She has tried OTC meds and nothing is really helping. Please advise.

## 2018-11-08 NOTE — Telephone Encounter (Signed)
Spoke with Christina Nixon. She is unable to come for an appt today. She reported a new rash on L side of her nose. Also existing spot near R eye (itching), chin. She previously tried hydrocortisone cream, vaseline, neosporin. She is trying polysporin right now. She took taken Benadryl Friday night and will likely repeat it this evening. I advised her per Dr. Lucia Gaskins to send [place] pictures through her mychart account and we will go from there. She verbalized understanding and appreciation.

## 2018-11-08 NOTE — Telephone Encounter (Addendum)
Pt is calling to inform that the prednisone has not helped her rash from the Aimovig, pt states the rash has actually spread a little.  Pt asking for a call as to what can be done at this point.  Pt states she will be available between 11:30 and 1 and then anytime after 2pm

## 2018-11-08 NOTE — Telephone Encounter (Signed)
Can you have her come in? I need to see the rash. Maybe Amy or whoever has an opening can take a look?

## 2018-11-11 ENCOUNTER — Telehealth: Payer: Self-pay | Admitting: *Deleted

## 2018-11-11 NOTE — Telephone Encounter (Signed)
Pt talked with [employer] last night and was told when PA was sent back if it is marked "urgent" it would be processed faster. FYI

## 2018-11-11 NOTE — Telephone Encounter (Signed)
Ajovy PA on CMM, key [id number],  Dx: G43.001 She has failed: Aimovig-rash, propanolol, Tylenol, Effexor, sumatriptan- worse headaches, Toradol, BC powders, nerve blocks. [place], ID: [id number], Your information has been submitted and will be reviewed by [facility]. An electronic determination will be received in CoverMyMeds within 72-120 hours. You will receive a fax copy of the determination. If Rosann Auerbach has not responded in 120 hours, contact [employer] at [phone].

## 2018-11-16 ENCOUNTER — Encounter: Payer: Self-pay | Admitting: *Deleted

## 2018-11-16 NOTE — Telephone Encounter (Addendum)
PA Case: [id number], Status: Approved, Coverage Starts on: 11/16/2018 12:00:00 AM, Coverage Ends on: 05/17/2019 12:00:00 AM Called patient and advised her of approval. She stated she picked it up Sat and gave herself the injection. She verbalized understanding, appreciation.

## 2018-11-16 NOTE — Telephone Encounter (Signed)
Ajovy 225 mg approved by [employer] from 11/16/2018 through 05/17/2019. Messaged pt in [place]. [facility] Received a receipt of confirmation.  Request ID# [id number]

## 2018-11-30 ENCOUNTER — Other Ambulatory Visit: Payer: Self-pay | Admitting: Neurology

## 2019-01-06 NOTE — Telephone Encounter (Signed)
We received another PA on Cover My Meds for Ajovy (via [employer] coverage). Called [facility] and spoke with staff member. He stated they are receiving a message saying PA is required however pt has been picking up medication since approval in February. He stated he would call the hub now and look into it.

## 2019-02-01 ENCOUNTER — Other Ambulatory Visit: Payer: Self-pay | Admitting: *Deleted

## 2019-02-01 MED ORDER — TOPIRAMATE 100 MG PO TABS: 100.0000 mg | ORAL_TABLET | Freq: Every day | ORAL | 6 refills | Status: DC

## 2019-02-10 NOTE — Telephone Encounter (Signed)
I spoke with the patient and let her know that we have a current approval on file with [employer] through 05/17/2019 and a year's worth of refills of [facility] [place] [facility] [address] in January. Pt verbalized appreciation and she will call the pharmacy to discuss.

## 2019-02-10 NOTE — Telephone Encounter (Signed)
Patient called to check status of the Ajovy refill. She states that shes due to take it tomorrow. Please call and advise.

## 2019-02-17 ENCOUNTER — Telehealth: Payer: Self-pay | Admitting: Neurology

## 2019-02-17 ENCOUNTER — Encounter: Payer: Self-pay | Admitting: *Deleted

## 2019-02-17 NOTE — Telephone Encounter (Signed)
Pt called wanting to know if she is to continue with the topiramate (TOPAMAX) 50 MG tablet and the topiramate (TOPAMAX) 100 MG tablet now that she is reaching the 6 month mark of her getting the headache injections. Please advise.

## 2019-02-17 NOTE — Telephone Encounter (Signed)
Will send message through pt's active mychart.

## 2019-03-29 ENCOUNTER — Ambulatory Visit: Payer: Managed Care, Other (non HMO) | Admitting: Allergy

## 2019-03-31 ENCOUNTER — Other Ambulatory Visit: Payer: Self-pay

## 2019-03-31 ENCOUNTER — Ambulatory Visit (INDEPENDENT_AMBULATORY_CARE_PROVIDER_SITE_OTHER): Payer: Managed Care, Other (non HMO) | Admitting: Allergy and Immunology

## 2019-03-31 ENCOUNTER — Encounter: Payer: Self-pay | Admitting: Allergy and Immunology

## 2019-03-31 VITALS — BP 118/64 | HR 88 | Temp 98.7°F | Resp 18 | Ht 62.6 in | Wt 131.4 lb

## 2019-03-31 DIAGNOSIS — T63441D Toxic effect of venom of bees, accidental (unintentional), subsequent encounter: Secondary | ICD-10-CM

## 2019-03-31 DIAGNOSIS — M3500 Sicca syndrome, unspecified: Secondary | ICD-10-CM | POA: Diagnosis not present

## 2019-03-31 DIAGNOSIS — L501 Idiopathic urticaria: Secondary | ICD-10-CM

## 2019-03-31 DIAGNOSIS — T63421A Toxic effect of venom of ants, accidental (unintentional), initial encounter: Secondary | ICD-10-CM

## 2019-03-31 MED ORDER — AUVI-Q 0.3 MG/0.3ML IJ SOAJ: INTRAMUSCULAR | 3 refills | Status: DC

## 2019-03-31 MED ORDER — MONTELUKAST SODIUM 10 MG PO TABS: ORAL_TABLET | ORAL | 5 refills | Status: DC

## 2019-03-31 MED ORDER — OMALIZUMAB 150 MG ~~LOC~~ SOLR
300.0000 mg | SUBCUTANEOUS | Status: DC
  Administered 2019-03-31 – 2019-08-18 (×7): 300 mg via SUBCUTANEOUS

## 2019-03-31 NOTE — Progress Notes (Signed)
[facility] [place] [facility] [place] - [place] - [place]   Dear Dr. Holly Bodily,  Thank you for referring Christina Nixon to the [facility] on 03/31/2019.   Below is a summation of this patient's evaluation and recommendations.  Thank you for your referral. I will keep you informed about this patient's response to treatment.   If you have any questions please do not hesitate to contact me.   Sincerely,  Jiles Prows, MD Allergy / Immunology [facility]   ______________________________________________________________________    NEW PATIENT NOTE  Referring Provider: Maryruth Hancock, MD Primary Provider: Maryruth Hancock, MD Date of office visit: 03/31/2019    Subjective:   Chief Complaint:  Christina Nixon (DOB: [birth year]) is a 48 y.o. female who presents to the clinic on 03/31/2019 with a chief complaint of Urticaria (Since May 2020) .     HPI: Christina Nixon presents to this clinic and evaluation of urticaria.  Christina Nixon has a history of developing a solitary episode of global urticaria after being stung on her posterior thigh by some unknown insect while laying in the grass about 4 years ago.  With that event she had urticaria across her body without any associated systemic or constitutional symptoms requiring emergency room evaluation.  Apparently her urticaria started about 30 to 60 minutes after that staying.  She was treated successfully and did not have any long-term sequela from that event.    On 02 Feb 2019 she developed urticaria that has been an active issue in the face of utilizing systemic steroids at least on 2 occasions and a multitude of different antihistamines.  She will develop red raised itchy lesions across her body that last hours to a day and do not heal with scar or hyperpigmentation unless she excoriates that area significantly.  She has intense pruritus and cannot stop  excoriating her skin.  There is no associated systemic or constitutional symptoms.  There is no obvious trigger giving rise to this issue.  Apparently she had a blood test for hyperthyroidism which was negative.  She has eliminated Pristiq and Topamax and various supplements during this timeframe.  It should be noted that she has been using Ajovi for her migraine headaches with her initial dose being 12 December 2018.  She really has no other associated atopic disease other than an episode of "sinusitis" about twice a year usually during spring and fall season changes.  She does have dry eye syndrome and apparently also has dry mouth.  There are no other symptoms to suggest an ongoing connective tissue disease.  Past Medical History:  Diagnosis Date  . Migraine   . Urticaria     Past Surgical History:  Procedure Laterality Date  . THYROIDECTOMY, PARTIAL  1997    Allergies as of 03/31/2019      Reactions   Aimovig [erenumab-aooe] Rash   Facial rash      Medication List    amphetamine-dextroamphetamine 20 MG tablet Commonly known as: ADDERALL Take 40 mg by mouth daily.   BENADRYL ALLERGY PO Take by mouth as needed.   cetirizine 10 MG tablet Commonly known as: ZYRTEC Take 10 mg by mouth as needed for allergies.   escitalopram 10 MG tablet Commonly known as: LEXAPRO Take 10 mg by mouth daily.   fexofenadine 180 MG tablet Commonly known as: ALLEGRA Take 180 mg by mouth as needed for allergies or rhinitis.  Fremanezumab-vfrm 225 MG/1.5ML Sosy Commonly known as: Ajovy Inject 225 mg into the skin every 30 (thirty) days.   hydrOXYzine 25 MG tablet Commonly known as: ATARAX/VISTARIL TAKE 1 2 TABLETS BY MOUTH EVERY 4 6 HOURS AS NEEDED URTICARIA   levonorgestrel 20 MCG/24HR IUD Commonly known as: MIRENA 1 each by Intrauterine route once.   meloxicam 15 MG tablet Commonly known as: MOBIC TAKE 1 TABLET BY MOUTH EVERY DAY What changed:   when to take this  reasons to take  this   RESTASIS OP Apply to eye.   Restasis 0.05 % ophthalmic emulsion Generic drug: cycloSPORINE INSTILL 1 DROP INTO BOTH EYES TWICE A DAY   SUMAtriptan 50 MG tablet Commonly known as: IMITREX TAKE 1 TABLET BY MOUTH AS NEEDED FOR MIGRAINE. MAY REPEAT IN 2 HRS IF HEADACHE PERSISTS OR RECURS. MAX 2 TABS/24 HOURS.   triamcinolone cream 0.1 % Commonly known as: KENALOG APPLY 3 TIMES DAILY AS NEEDED TO RASH       Review of systems negative except as noted in HPI / PMHx or noted below:  Review of Systems  Constitutional: Negative.   HENT: Negative.   Eyes: Negative.   Respiratory: Negative.   Cardiovascular: Negative.   Gastrointestinal: Negative.   Genitourinary: Negative.   Musculoskeletal: Negative.   Skin: Negative.   Neurological: Negative.   Endo/Heme/Allergies: Negative.   Psychiatric/Behavioral: Negative.     Family History  Problem Relation Age of Onset  . COPD Mother   . Arthritis Mother   . Other Mother        gastric dumping syndrome   . Kidney disease Mother        stage 3  . High Cholesterol Mother   . Lung cancer Father   . Cancer Sister        external female cancer   . Diabetes Sister   . Arthritis Sister   . Other Sister        gastric dumping syndrome   . High blood pressure Sister   . Migraines Sister     Social History   Socioeconomic History  . Marital status: Single    Spouse name: Not on file  . Number of children: 0  . Years of education: Not on file  . Highest education level: Bachelor's degree (e.g., BA, AB, BS)  Occupational History  . Not on file  Social Needs  . Financial resource strain: Not on file  . Food insecurity    Worry: Not on file    Inability: Not on file  . Transportation needs    Medical: Not on file    Non-medical: Not on file  Tobacco Use  . Smoking status: Never Smoker  . Smokeless tobacco: Never Used  Substance and Sexual Activity  . Alcohol use: Yes    Alcohol/week: 14.0 standard drinks     Types: 14 Cans of beer per week    Comment: daily  . Drug use: Never  . Sexual activity: Not on file  Lifestyle  . Physical activity    Days per week: Not on file    Minutes per session: Not on file  . Stress: Not on file  Relationships  . Social Musicianconnections    Talks on phone: Not on file    Gets together: Not on file    Attends religious service: Not on file    Active member of club or organization: Not on file    Attends meetings of clubs or organizations: Not on file  Relationship status: Not on file  . Intimate partner violence    Fear of current or ex partner: Not on file    Emotionally abused: Not on file    Physically abused: Not on file    Forced sexual activity: Not on file  Other Topics Concern  . Not on file  Social History Narrative   Lives at home alone    Right handed   Caffeine: 2 cups daily    Environmental and Social history  Lives in a house with a dry environment, a dog located inside the household, no carpet in the bedroom, no plastic on the bed, no plastic on the pillow, no smoking ongoing with inside the household.  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  Objective:   Vitals:   03/31/19 1017  BP: 118/64  Pulse: 88  Resp: 18  Temp: 98.7 F (37.1 C)  SpO2: 98%   Height: 5' 2.6" (159 cm) Weight: 131 lb 6.4 oz (59.6 kg)  Physical Exam Constitutional:      Appearance: She is not diaphoretic.  HENT:     Head: Normocephalic. No right periorbital erythema or left periorbital erythema.     Right Ear: Tympanic membrane, ear canal and external ear normal.     Left Ear: Tympanic membrane, ear canal and external ear normal.     Nose: Nose normal. No mucosal edema or rhinorrhea.     Mouth/Throat:     Pharynx: No oropharyngeal exudate.  Eyes:     General: Lids are normal.     Conjunctiva/sclera: Conjunctivae normal.     Pupils: Pupils are equal, round, and reactive to light.  Neck:     Thyroid: No thyromegaly.     Trachea: Trachea normal. No  tracheal deviation.  Cardiovascular:     Rate and Rhythm: Normal rate and regular rhythm.     Heart sounds: Normal heart sounds, S1 normal and S2 normal. No murmur.  Pulmonary:     Effort: Pulmonary effort is normal. No respiratory distress.     Breath sounds: No stridor. No wheezing or rales.  Chest:     Chest wall: No tenderness.  Abdominal:     General: There is no distension.     Palpations: Abdomen is soft. There is no mass.     Tenderness: There is no abdominal tenderness. There is no guarding or rebound.  Musculoskeletal:        General: No tenderness.  Lymphadenopathy:     Head:     Right side of head: No tonsillar adenopathy.     Left side of head: No tonsillar adenopathy.     Cervical: No cervical adenopathy.  Skin:    Coloration: Skin is not pale.     Findings: Rash (Diffuse blanching urticarial lesions extremities) present. No erythema.     Nails: There is no clubbing.   Neurological:     Mental Status: She is alert.     Diagnostics: Allergy skin tests were performed.   Assessment and Plan:    1. Idiopathic urticaria   2. Systemic reaction to bee sting, accidental or unintentional, subsequent encounter   3. Fire ant bite, accidental or unintentional, initial encounter   4. Sicca syndrome (HCC)     1.  Cetirizine 10 mg - 1-2 tablets 1-2 times per day (40 mg max)  2.  Famotidine 20 mg - 1 tablet twice a day  3.  Montelukast 10 mg - 1 tablet daily  4.  Omalizumab 300 mg  today and every 4 weeks  5.  Can continue to add Benadryl or hydroxyzine as needed  6.  Auvi-Q 0.3, Benadryl, MD/ER evaluation for allergic reaction  5.  [facility]  7.  Blood - CBC w/diff, CMP, SED, CRP, Hymenoptera panel, Fire ant IgE, total serum IgE, tryptase, ANA w/Reflex  8.  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  She has been uncontrolled  with 2 courses of systemic steroids and multiple courses of antihistamines and we will now start her on omalizumab.  She has the option of utilizing H1 and H2 receptor blockers and a leukotriene modifier as noted above.  She also has a history consistent with hymenoptera venom hypersensitivity and she has what appears to be sicca syndrome.  We will have her undergo further evaluation for her immunological hyperreactivity and what appears to be other forms of atopic disease and possible sicca syndrome with the blood tests noted above.  I will see her back in this clinic in 3 weeks or earlier if there is a problem.  Jessica PriestEric J. , MD Allergy / Immunology [facility]

## 2019-03-31 NOTE — Patient Instructions (Addendum)
  1.  Cetirizine 10 mg - 1-2 tablets 1-2 times per day (40 mg max)  2.  Famotidine 20 mg - 1 tablet twice a day  3.  Montelukast 10 mg - 1 tablet daily  4.  Omalizumab 300 mg today and every 4 weeks  5.  Can continue to add Benadryl or hydroxyzine as needed  6.  Auvi-Q 0.3, Benadryl, MD/ER evaluation for allergic reaction  5.  Review thyroid function test from [facility]  7.  Blood - CBC w/diff, CMP, SED, CRP, Hymenoptera panel, Fire ant IgE, total serum IgE, tryptase, ANA w/ reflex  8. Return to clinic in 3 weeks or earlier if problem

## 2019-04-03 LAB — ANA W/REFLEX IF POSITIVE: Anti Nuclear Antibody (ANA): NEGATIVE

## 2019-04-04 ENCOUNTER — Encounter: Payer: Self-pay | Admitting: Allergy and Immunology

## 2019-04-05 ENCOUNTER — Other Ambulatory Visit: Payer: Self-pay | Admitting: *Deleted

## 2019-04-05 LAB — ALLERGEN HYMENOPTERA PANEL
Bumblebee: <0.1 kU/L
Honeybee IgE: <0.1 kU/L
Hornet, White Face, IgE: 6.4 kU/L — AB
Hornet, Yellow, IgE: 0.16 kU/L — AB
Paper Wasp IgE: 9.38 kU/L — AB
Yellow Jacket, IgE: 10.3 kU/L — AB

## 2019-04-05 LAB — IGE: IgE (Immunoglobulin E), Serum: 17 IU/mL (ref 6–495)

## 2019-04-05 LAB — CBC WITH DIFFERENTIAL/PLATELET
Basophils Absolute: 0 10*3/uL (ref 0.0–0.2)
Basos: 0 %
EOS (ABSOLUTE): 0 10*3/uL (ref 0.0–0.4)
Eos: 0 %
Hematocrit: 42.8 % (ref 34.0–46.6)
Hemoglobin: 14.2 g/dL (ref 11.1–15.9)
Immature Grans (Abs): 0 10*3/uL (ref 0.0–0.1)
Immature Granulocytes: 0 %
Lymphocytes Absolute: 1.3 10*3/uL (ref 0.7–3.1)
Lymphs: 15 %
MCH: 32.2 pg (ref 26.6–33.0)
MCHC: 33.2 g/dL (ref 31.5–35.7)
MCV: 97 fL (ref 79–97)
Monocytes Absolute: 0.5 10*3/uL (ref 0.1–0.9)
Monocytes: 5 %
Neutrophils Absolute: 7 10*3/uL (ref 1.4–7.0)
Neutrophils: 80 %
Platelets: 348 10*3/uL (ref 150–450)
RBC: 4.41 x10E6/uL (ref 3.77–5.28)
RDW: 12.2 % (ref 11.7–15.4)
WBC: 8.8 10*3/uL (ref 3.4–10.8)

## 2019-04-05 LAB — COMPREHENSIVE METABOLIC PANEL
ALT: 18 IU/L (ref 0–32)
AST: 17 IU/L (ref 0–40)
Albumin/Globulin Ratio: 2 (ref 1.2–2.2)
Albumin: 4.4 g/dL (ref 3.8–4.8)
Alkaline Phosphatase: 76 IU/L (ref 39–117)
BUN/Creatinine Ratio: 18 (ref 9–23)
BUN: 14 mg/dL (ref 6–24)
Bilirubin Total: 0.3 mg/dL (ref 0.0–1.2)
CO2: 27 mmol/L (ref 20–29)
Calcium: 9.4 mg/dL (ref 8.7–10.2)
Chloride: 101 mmol/L (ref 96–106)
Creatinine, Ser: 0.76 mg/dL (ref 0.57–1.00)
GFR calc Af Amer: 107 mL/min/{1.73_m2} (ref >59)
GFR calc non Af Amer: 93 mL/min/{1.73_m2} (ref >59)
Globulin, Total: 2.2 g/dL (ref 1.5–4.5)
Glucose: 89 mg/dL (ref 65–99)
Potassium: 4.1 mmol/L (ref 3.5–5.2)
Sodium: 141 mmol/L (ref 134–144)
Total Protein: 6.6 g/dL (ref 6.0–8.5)

## 2019-04-05 LAB — SEDIMENTATION RATE: Sed Rate: 2 mm/hr (ref 0–32)

## 2019-04-05 LAB — C-REACTIVE PROTEIN: CRP: 2 mg/L (ref 0–10)

## 2019-04-05 LAB — TRYPTASE: Tryptase: 2.5 ug/L (ref 2.2–13.2)

## 2019-04-05 LAB — ALLERGEN FIRE ANT: I070-IgE Fire Ant (Invicta): 2.1 kU/L — AB

## 2019-04-05 MED ORDER — EPINEPHRINE 0.3 MG/0.3ML IJ SOAJ: 0.3000 mg | Freq: Once | INTRAMUSCULAR | 2 refills | Status: AC

## 2019-04-21 ENCOUNTER — Ambulatory Visit (INDEPENDENT_AMBULATORY_CARE_PROVIDER_SITE_OTHER): Payer: Managed Care, Other (non HMO) | Admitting: Allergy and Immunology

## 2019-04-21 ENCOUNTER — Other Ambulatory Visit: Payer: Self-pay

## 2019-04-21 ENCOUNTER — Encounter: Payer: Self-pay | Admitting: Allergy and Immunology

## 2019-04-21 VITALS — BP 116/58 | HR 76 | Temp 98.2°F | Resp 20

## 2019-04-21 DIAGNOSIS — T63421A Toxic effect of venom of ants, accidental (unintentional), initial encounter: Secondary | ICD-10-CM | POA: Diagnosis not present

## 2019-04-21 DIAGNOSIS — T63441D Toxic effect of venom of bees, accidental (unintentional), subsequent encounter: Secondary | ICD-10-CM

## 2019-04-21 DIAGNOSIS — L501 Idiopathic urticaria: Secondary | ICD-10-CM

## 2019-04-21 NOTE — Patient Instructions (Addendum)
  1.  Cetirizine 10 mg - 1 tablet 1 time per day  2.  Famotidine 20 mg - 1 tablet 1 time a day  3.  Montelukast 10 mg - 1 tablet 1 time per day  4.  Omalizumab 300 mg every 4 weeks  5.  Can continue to add Benadryl or hydroxyzine as needed  6.  Auvi-Q 0.3, Benadryl, MD/ER evaluation for allergic reaction  7.  Return to clinic in 9 weeks or earlier if problem

## 2019-04-21 NOTE — Progress Notes (Signed)
[facility] [place] [facility] [place]   Follow-up Note  Referring Provider: Maryruth Hancock, MD Primary Provider: Maryruth Hancock, MD Date of Office Visit: 04/21/2019  Subjective:   Christina Nixon (DOB: 1971/05/11) is a 48 y.o. female who returns to the [facility] [place] on 04/21/2019 in re-evaluation of the following:  HPI: Christina Nixon returns to this clinic in reevaluation of her chronic urticaria and hymenoptera venom hypersensitivity state and history of sicca syndrome.  I last saw her in this clinic on 31 March 2019 at which point time we addressed these issues which included administration of omalizumab.  She is much better with her hives.  They are much less intense.  They still have the same frequency that has been pretty consistent for the past several months which is to have an outbreak every morning.  But they are also much less itchy.  She has been using cetirizine once a day and famotidine twice a day and montelukast daily.  She does not use any additional medications for her hives.  Allergies as of 04/21/2019      Reactions   Aimovig [erenumab-aooe] Rash   Facial rash      Medication List    amphetamine-dextroamphetamine 20 MG tablet Commonly known as: ADDERALL Take 40 mg by mouth daily.   Auvi-Q 0.3 mg/0.3 mL Soaj injection Generic drug: EPINEPHrine Use as directed for life threatening allergic reactions   BENADRYL ALLERGY PO Take by mouth as needed.   cetirizine 10 MG tablet Commonly known as: ZYRTEC Take 10 mg by mouth as needed for allergies.   escitalopram 10 MG tablet Commonly known as: LEXAPRO Take 10 mg by mouth daily.   Fremanezumab-vfrm 225 MG/1.5ML Sosy Commonly known as: Ajovy Inject 225 mg into the skin every 30 (thirty) days.   hydrOXYzine 25 MG tablet Commonly known as: ATARAX/VISTARIL TAKE 1 2 TABLETS BY MOUTH EVERY 4 6 HOURS AS NEEDED URTICARIA   levonorgestrel 20 MCG/24HR IUD Commonly known as: MIRENA 1 each  by Intrauterine route once.   meloxicam 15 MG tablet Commonly known as: MOBIC TAKE 1 TABLET BY MOUTH EVERY DAY What changed:   when to take this  reasons to take this   montelukast 10 MG tablet Commonly known as: Singulair Take one tablet once daily as directed   Restasis 0.05 % ophthalmic emulsion Generic drug: cycloSPORINE INSTILL 1 DROP INTO BOTH EYES TWICE A DAY   SUMAtriptan 50 MG tablet Commonly known as: IMITREX TAKE 1 TABLET BY MOUTH AS NEEDED FOR MIGRAINE. MAY REPEAT IN 2 HRS IF HEADACHE PERSISTS OR RECURS. MAX 2 TABS/24 HOURS.   triamcinolone cream 0.1 % Commonly known as: KENALOG APPLY 3 TIMES DAILY AS NEEDED TO RASH       Past Medical History:  Diagnosis Date  . Migraine   . Urticaria     Past Surgical History:  Procedure Laterality Date  . THYROIDECTOMY, PARTIAL  1997    Review of systems negative except as noted in HPI / PMHx or noted below:  Review of Systems  Constitutional: Negative.   HENT: Negative.   Eyes: Negative.   Respiratory: Negative.   Cardiovascular: Negative.   Gastrointestinal: Negative.   Genitourinary: Negative.   Musculoskeletal: Negative.   Skin: Negative.   Neurological: Negative.   Endo/Heme/Allergies: Negative.   Psychiatric/Behavioral: Negative.      Objective:   Vitals:   04/21/19 1637  BP: (!) 116/58  Pulse: 76  Resp: 20  Temp: 98.2  F (36.8 C)  SpO2: 99%          Physical Exam Constitutional:      Appearance: She is not diaphoretic.  HENT:     Head: Normocephalic.     Right Ear: Tympanic membrane, ear canal and external ear normal.     Left Ear: Tympanic membrane, ear canal and external ear normal.     Nose: Nose normal. No mucosal edema or rhinorrhea.     Mouth/Throat:     Pharynx: Uvula midline. No oropharyngeal exudate.  Eyes:     Conjunctiva/sclera: Conjunctivae normal.  Neck:     Thyroid: No thyromegaly.     Trachea: Trachea normal. No tracheal tenderness or tracheal deviation.   Cardiovascular:     Rate and Rhythm: Normal rate and regular rhythm.     Heart sounds: Normal heart sounds, S1 normal and S2 normal. No murmur.  Pulmonary:     Effort: No respiratory distress.     Breath sounds: Normal breath sounds. No stridor. No wheezing or rales.  Lymphadenopathy:     Head:     Right side of head: No tonsillar adenopathy.     Left side of head: No tonsillar adenopathy.     Cervical: No cervical adenopathy.  Skin:    Findings: No erythema or rash.     Nails: There is no clubbing.   Neurological:     Mental Status: She is alert.     Diagnostics:     Results of blood tests obtained 31 March 2019 identifies creatinine 0.76 mg/DL, AST 17 U/L, ALT 18 U/L, tryptase 2.5 UG/L, C-reactive protein 2 mg/L, WBC 8.8, absolute eosinophil 0, absolute lymphocyte 1300, hemoglobin 14.2, platelet 348, fire ant IgE 2.10 KU/L, white faced hornet 6.40 KU/L, yellowjacket 10.30 KU/L, wasp 9.38 KU/L, hornet 0.16 KU/L, honeybee less than 0.10 KU/L, total serum IgE 17 U/mL, negative ANA  Results of blood tests obtained 07 Feb 2019 identified TSH 0.834  Assessment and Plan:   1. Idiopathic urticaria   2. Systemic reaction to bee sting, accidental or unintentional, subsequent encounter   3. Fire ant bite, accidental or unintentional, initial encounter     1.  Cetirizine 10 mg - 1 tablet 1 time per day  2.  Famotidine 20 mg - 1 tablet 1 time a day  3.  Montelukast 10 mg - 1 tablet 1 time per day  4.  Omalizumab 300 mg every 4 weeks  5.  Can continue to add Benadryl or hydroxyzine as needed  6.  Auvi-Q 0.3, Benadryl, MD/ER evaluation for allergic reaction  7.  Return to clinic in 9 weeks or earlier if problem  Christina Nixon has a overactive immune system which is coming under better control on her current course of therapy noted above which includes the use of omalizumab.  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  I would like to see her back in this clinic after a full 12 weeks of treatment.  Thus, she will return to this clinic in 9 weeks or earlier if there is a problem. Christina SchimkeEric Kozlow, MD Allergy / Immunology [facility]

## 2019-04-25 ENCOUNTER — Encounter: Payer: Self-pay | Admitting: Allergy and Immunology

## 2019-04-28 ENCOUNTER — Other Ambulatory Visit: Payer: Self-pay | Admitting: Neurology

## 2019-04-28 ENCOUNTER — Other Ambulatory Visit: Payer: Self-pay

## 2019-04-28 ENCOUNTER — Ambulatory Visit (INDEPENDENT_AMBULATORY_CARE_PROVIDER_SITE_OTHER): Payer: Managed Care, Other (non HMO) | Admitting: *Deleted

## 2019-04-28 DIAGNOSIS — L501 Idiopathic urticaria: Secondary | ICD-10-CM | POA: Diagnosis not present

## 2019-04-28 NOTE — Telephone Encounter (Signed)
Refilled x 3 months with note to pharmacy: have patient call and schedule FU by Oct 2020.

## 2019-05-05 ENCOUNTER — Telehealth: Payer: Self-pay | Admitting: *Deleted

## 2019-05-05 NOTE — Telephone Encounter (Signed)
Received another PA request for Ajovy. Attempted to send to plan on CMM (KEY: [id number]) and received this message from [employer]: Available without authorization.

## 2019-05-13 NOTE — Telephone Encounter (Signed)
Pt is asking for a call on Monday about the injection, she states she is due on 08-14.  Please call on MOnday

## 2019-05-16 ENCOUNTER — Encounter: Payer: Self-pay | Admitting: *Deleted

## 2019-05-16 NOTE — Telephone Encounter (Signed)
[facility]. The Ajovy was out of stock and they had to order it. The medication has arrived and they will fill it for the pt today.   Sent pt a Pharmacist, community message with update.

## 2019-05-17 NOTE — Telephone Encounter (Signed)
Completed Ajovy PA on CMM. Key: [id number]. Awaiting [employer] determination within 72-120 hours.

## 2019-05-17 NOTE — Telephone Encounter (Signed)
Received fax from [facility]. Ajovy 225 mg approved from 05/17/2019 through 05/16/2020. I faxed a copy of approval to pt's pharmacy. Received a receipt of confirmation. Sent pt mychart update.

## 2019-05-26 ENCOUNTER — Other Ambulatory Visit: Payer: Self-pay

## 2019-05-26 ENCOUNTER — Ambulatory Visit (INDEPENDENT_AMBULATORY_CARE_PROVIDER_SITE_OTHER): Payer: Managed Care, Other (non HMO) | Admitting: *Deleted

## 2019-05-26 DIAGNOSIS — L501 Idiopathic urticaria: Secondary | ICD-10-CM | POA: Diagnosis not present

## 2019-06-13 ENCOUNTER — Other Ambulatory Visit: Payer: Self-pay

## 2019-06-13 ENCOUNTER — Ambulatory Visit (INDEPENDENT_AMBULATORY_CARE_PROVIDER_SITE_OTHER): Payer: Managed Care, Other (non HMO) | Admitting: Allergy and Immunology

## 2019-06-13 ENCOUNTER — Encounter: Payer: Self-pay | Admitting: Allergy and Immunology

## 2019-06-13 VITALS — BP 122/72 | HR 71 | Temp 98.1°F | Resp 16

## 2019-06-13 DIAGNOSIS — T63421A Toxic effect of venom of ants, accidental (unintentional), initial encounter: Secondary | ICD-10-CM

## 2019-06-13 DIAGNOSIS — L989 Disorder of the skin and subcutaneous tissue, unspecified: Secondary | ICD-10-CM

## 2019-06-13 DIAGNOSIS — T63441D Toxic effect of venom of bees, accidental (unintentional), subsequent encounter: Secondary | ICD-10-CM

## 2019-06-13 DIAGNOSIS — L308 Other specified dermatitis: Secondary | ICD-10-CM

## 2019-06-13 DIAGNOSIS — L501 Idiopathic urticaria: Secondary | ICD-10-CM

## 2019-06-13 NOTE — Progress Notes (Signed)
[place]   Follow-up Note  Referring Provider: Wandra Feinsteinorum, Lisa L, MD Primary Provider: Augustine RadarNodal, Jr Reinaldo, PA-C Date of Office Visit: 06/13/2019  Subjective:   Christina Nixon Christina Nixon (DOB: 1970-11-27) is a 48 y.o. female who [facility] in re-evaluation of the following:  HPI: Christina Nixon returns to this clinic in evaluation of chronic urticaria and a history of hymenoptera venom hypersensitivity state.  I last saw her in this clinic on 21 April 2019.  She continued to do very well while utilizing omalizumab and cetirizine 10 mg daily along with an H2 receptor blocker and montelukast regarding her hives.    However, about 10 days ago she developed more lesions on her body and these lesions appeared to be deeper and painful and burning and a left behind bruising.  She had one on each calf and she developed 1 on her right hip.  She did not have any associated systemic or constitutional symptoms.  She did start a new supplement immediately prior to the onset of this reaction.  Allergies as of 06/13/2019      Reactions   Aimovig [erenumab-aooe] Rash   Facial rash      Medication List      amphetamine-dextroamphetamine 20 MG tablet Commonly known as: ADDERALL Take 40 mg by mouth daily.   Auvi-Q 0.3 mg/0.3 mL Soaj injection Generic drug: EPINEPHrine Use as directed for life threatening allergic reactions   BENADRYL ALLERGY PO Take by mouth as needed.   cetirizine 10 MG tablet Commonly known as: ZYRTEC Take 10 mg by mouth as needed for allergies.   escitalopram 10 MG tablet Commonly known as: LEXAPRO Take 10 mg by mouth daily.   famotidine 20 MG tablet Commonly known as: PEPCID Take 20 mg by mouth daily.   Fremanezumab-vfrm 225 MG/1.5ML Sosy Commonly known as: Ajovy Inject 225 mg into the skin every 30 (thirty) days.   hydrOXYzine 25 MG tablet Commonly known as: ATARAX/VISTARIL TAKE 1 2  TABLETS BY MOUTH EVERY 4 6 HOURS AS NEEDED URTICARIA   levonorgestrel 20 MCG/24HR IUD Commonly known as: MIRENA 1 each by Intrauterine route once.   meloxicam 15 MG tablet Commonly known as: MOBIC TAKE 1 TABLET BY MOUTH EVERY DAY What changed:   when to take this  reasons to take this   montelukast 10 MG tablet Commonly known as: Singulair Take one tablet once daily as directed   Restasis 0.05 % ophthalmic emulsion Generic drug: cycloSPORINE INSTILL 1 DROP INTO BOTH EYES TWICE A DAY   SUMAtriptan 50 MG tablet Commonly known as: IMITREX TAKE 1 TABLET BY MOUTH AS NEEDED FOR MIGRAINE. MAY REPEAT IN 2 HRS IF HEADACHE PERSISTS OR RECURS. MAX 2 TABS/24 HOURS.   triamcinolone cream 0.1 % Commonly known as: KENALOG APPLY 3 TIMES DAILY AS NEEDED TO RASH       Past Medical History:  Diagnosis Date  . Migraine   . Urticaria     Past Surgical History:  Procedure Laterality Date  . THYROIDECTOMY, PARTIAL  1997    Review of systems negative except as noted in HPI / PMHx or noted below:  Review of Systems  Constitutional: Negative.   HENT: Negative.   Eyes: Negative.   Respiratory: Negative.   Cardiovascular: Negative.   Gastrointestinal: Negative.   Genitourinary: Negative.   Musculoskeletal: Negative.   Skin: Negative.   Neurological: Negative.   Endo/Heme/Allergies: Negative.   Psychiatric/Behavioral: Negative.  Objective:   Vitals:   06/13/19 1735  BP: 122/72  Pulse: 71  Resp: 16  Temp: 98.1 F (36.7 C)  SpO2: 97%          Physical Exam Skin:    Findings: Rash (Hyperpigmented area left upper posterior calf and posterior thigh) present.     Diagnostics: none   Assessment and Plan:   1. Idiopathic urticaria   2. Inflammatory dermatosis   3. Systemic reaction to bee sting, accidental or unintentional, subsequent encounter   4. Fire ant bite, accidental or unintentional, initial encounter     1.  Cetirizine 10 mg - 1-2 tablets 1-2  times per day (max=40mg )  2.  Famotidine 20 mg - 1 tablet 1 time a day  3.  Montelukast 10 mg - 1 tablet 1 time per day  4.  Omalizumab 300 mg every 4 weeks  5.  Can continue to add Benadryl or hydroxyzine as needed  6.  Auvi-Q 0.3, Benadryl, MD/ER evaluation for allergic reaction  7. Discontinue supplement  8. Arrange for biopsy of swollen area with [facility]  9. Obtain fall flu vaccine (and COVID vaccine)  10. Return to clinic in 6 months or earlier if problem  Christina Nixon had pretty good control of her urticaria until the past 10 days and then she developed a form of inflammatory dermatosis with capillary fragility and what appears to be bruising.  This occurred temporally related to the consumption of a supplement and I have asked her to discontinue all of her supplements.  If she continues to have these issues then she will require a biopsy with [facility] dermatology looking for leukocytoclastic vasculitis or erythema nodosum.  Assuming she does resolve this inflammatory dermatosis with discontinuation of her supplement and she has good control of her chronic urticaria with the therapy noted above then I will just see her back in this clinic in 6 months or earlier if there is a problem.  Allena Katz, MD Allergy / Immunology [place]

## 2019-06-13 NOTE — Patient Instructions (Addendum)
  1.  Cetirizine 10 mg - 1-2 tablets 1-2 times per day (max=40mg )  2.  Famotidine 20 mg - 1 tablet 1 time a day  3.  Montelukast 10 mg - 1 tablet 1 time per day  4.  Omalizumab 300 mg every 4 weeks  5.  Can continue to add Benadryl or hydroxyzine as needed  6.  Auvi-Q 0.3, Benadryl, MD/ER evaluation for allergic reaction  7. Discontinue supplement  8. Arrange for biopsy of swollen area with [facility]  9. Obtain fall flu vaccine (and COVID vaccine)  10. Return to clinic in 6 months or earlier if problem

## 2019-06-14 ENCOUNTER — Encounter: Payer: Self-pay | Admitting: Allergy and Immunology

## 2019-06-20 ENCOUNTER — Ambulatory Visit: Payer: Managed Care, Other (non HMO) | Admitting: Allergy and Immunology

## 2019-06-23 ENCOUNTER — Telehealth: Payer: Self-pay | Admitting: Neurology

## 2019-06-23 ENCOUNTER — Other Ambulatory Visit: Payer: Self-pay | Admitting: Neurology

## 2019-06-23 ENCOUNTER — Ambulatory Visit (INDEPENDENT_AMBULATORY_CARE_PROVIDER_SITE_OTHER): Payer: Managed Care, Other (non HMO) | Admitting: *Deleted

## 2019-06-23 ENCOUNTER — Other Ambulatory Visit: Payer: Self-pay

## 2019-06-23 DIAGNOSIS — L501 Idiopathic urticaria: Secondary | ICD-10-CM | POA: Diagnosis not present

## 2019-06-23 MED ORDER — TOPIRAMATE 100 MG PO TABS: 100.0000 mg | ORAL_TABLET | Freq: Every day | ORAL | 3 refills | Status: DC

## 2019-06-23 MED ORDER — EMGALITY 120 MG/ML ~~LOC~~ SOAJ: 120.0000 mg | SUBCUTANEOUS | 11 refills | Status: DC

## 2019-06-23 NOTE — Progress Notes (Signed)
emg 

## 2019-06-23 NOTE — Telephone Encounter (Signed)
I will document the call in the mychart encounter.

## 2019-06-23 NOTE — Telephone Encounter (Signed)
Patient asked for a call back regarding the message. I spoke with her. Regarding the request for Topiramate, she is concerned that if she restarts it she will have to wait a few weeks to get the full effect and is concerned she will be back where she was last year. She has had some small headaches since she skipped the last Ajovy injection. She stated she would rather have the hives and itch than have a migraine. She was assured the messages would be sent to Dr. Jaynee Eagles. Pt was advise that if she doesn't hear back before the weekend she can call the office to speak with the on-call MD. Pt verbalized appreciation. She stated she may end up taking the Ajovy if her headaches worsen over the weekend.

## 2019-06-23 NOTE — Telephone Encounter (Signed)
Pt sent a MyChart message and would like to discuss more in detail. Please advise.

## 2019-06-27 ENCOUNTER — Telehealth: Payer: Self-pay

## 2019-06-27 NOTE — Telephone Encounter (Signed)
PA for Emgality has been submitted via cover my meds through [place].  (Key: [id number])  Your information has been submitted and will be reviewed by [employer]. You may close this dialog, return to your dashboard, and perform other tasks. An electronic determination will be received in CoverMyMeds within 72-120 hours. You can see the latest determination by locating this request on your dashboard or by reopening this request. You will receive a fax copy of the determination. If Christella Scheuermann has not responded in 120 hours, contact [employer] at [phone].

## 2019-06-30 ENCOUNTER — Other Ambulatory Visit: Payer: Self-pay | Admitting: *Deleted

## 2019-06-30 NOTE — Telephone Encounter (Signed)
I received denial back from [place]. Insurance has denied based on the fact the pt has not tried and failed: injectable sumatriptan or zomig nasal spray.

## 2019-06-30 NOTE — Telephone Encounter (Signed)
Emgality PA was completed for migraine prevention. Key: [id number]. Awaiting [employer] determination between 72-120 hours.

## 2019-07-04 NOTE — Telephone Encounter (Addendum)
[employer] approved Emgality PA from 07/01/2019-12/30/2019. [facility] informed.

## 2019-07-21 ENCOUNTER — Ambulatory Visit (INDEPENDENT_AMBULATORY_CARE_PROVIDER_SITE_OTHER): Payer: Managed Care, Other (non HMO)

## 2019-07-21 ENCOUNTER — Other Ambulatory Visit: Payer: Self-pay

## 2019-07-21 DIAGNOSIS — L501 Idiopathic urticaria: Secondary | ICD-10-CM

## 2019-08-04 ENCOUNTER — Other Ambulatory Visit: Payer: Self-pay | Admitting: Neurology

## 2019-08-18 ENCOUNTER — Ambulatory Visit (INDEPENDENT_AMBULATORY_CARE_PROVIDER_SITE_OTHER): Payer: Managed Care, Other (non HMO)

## 2019-08-18 DIAGNOSIS — L501 Idiopathic urticaria: Secondary | ICD-10-CM

## 2019-09-13 ENCOUNTER — Other Ambulatory Visit: Payer: Self-pay | Admitting: Allergy and Immunology

## 2019-09-15 ENCOUNTER — Ambulatory Visit (INDEPENDENT_AMBULATORY_CARE_PROVIDER_SITE_OTHER): Payer: Managed Care, Other (non HMO)

## 2019-09-15 ENCOUNTER — Other Ambulatory Visit: Payer: Self-pay

## 2019-09-15 DIAGNOSIS — L501 Idiopathic urticaria: Secondary | ICD-10-CM

## 2019-09-15 MED ORDER — OMALIZUMAB 150 MG ~~LOC~~ SOLR: 150.0000 mg | SUBCUTANEOUS | Status: DC

## 2019-09-15 MED ORDER — OMALIZUMAB 150 MG ~~LOC~~ SOLR
300.0000 mg | SUBCUTANEOUS | Status: AC
  Administered 2019-09-15 – 2022-06-18 (×29): 300 mg via SUBCUTANEOUS

## 2019-10-13 ENCOUNTER — Other Ambulatory Visit: Payer: Self-pay

## 2019-10-13 ENCOUNTER — Ambulatory Visit (INDEPENDENT_AMBULATORY_CARE_PROVIDER_SITE_OTHER): Payer: Managed Care, Other (non HMO)

## 2019-10-13 DIAGNOSIS — L501 Idiopathic urticaria: Secondary | ICD-10-CM | POA: Diagnosis not present

## 2019-11-06 ENCOUNTER — Other Ambulatory Visit: Payer: Self-pay | Admitting: Neurology

## 2019-11-10 ENCOUNTER — Other Ambulatory Visit: Payer: Self-pay

## 2019-11-10 ENCOUNTER — Encounter: Payer: Self-pay | Admitting: *Deleted

## 2019-11-10 ENCOUNTER — Ambulatory Visit (INDEPENDENT_AMBULATORY_CARE_PROVIDER_SITE_OTHER): Payer: Managed Care, Other (non HMO) | Admitting: *Deleted

## 2019-11-10 ENCOUNTER — Other Ambulatory Visit: Payer: Self-pay | Admitting: Neurology

## 2019-11-10 ENCOUNTER — Telehealth: Payer: Self-pay | Admitting: Neurology

## 2019-11-10 DIAGNOSIS — L501 Idiopathic urticaria: Secondary | ICD-10-CM

## 2019-11-10 NOTE — Telephone Encounter (Signed)
Pt is needing a refill on her Ajovy and have [facility] sent [facility] to the [facility] in [place] on [address]

## 2019-11-10 NOTE — Telephone Encounter (Signed)
I sent in one refill. Pt last seen 07/2018. Appt needed for further refills. Sent pt a [facility] geologist.

## 2019-11-27 IMAGING — MR MR MRV HEAD W/O CM
10 of 13 series · 28 of 48 positions shown · non-contrast
Comparison: None.

CLINICAL DATA: Acute headache

EXAM:
MRI HEAD WITHOUT CONTRAST
MRA HEAD WITHOUT CONTRAST
MRV HEAD WITHOUT CONTRAST
TECHNIQUE: Multiplanar, multiecho pulse sequences of the brain and surrounding
structures were obtained without intravenous contrast. Angiographic
images of the intracranial venous structures were obtained using MRV
technique without intravenous contrast. Angiographic time-of-flight
images of the intracranial arteries were obtained without
intravenous contrast.

[Series 2: MRV · coronal · 1.5mm · 0.43mm/px · 7 of 136 slices shown]
[im 1/136]
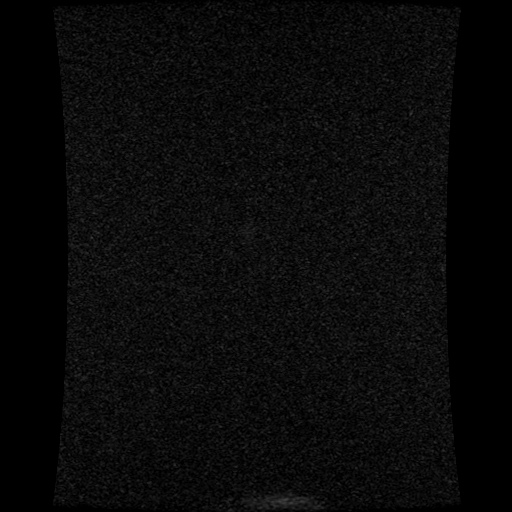
[im 23/136]
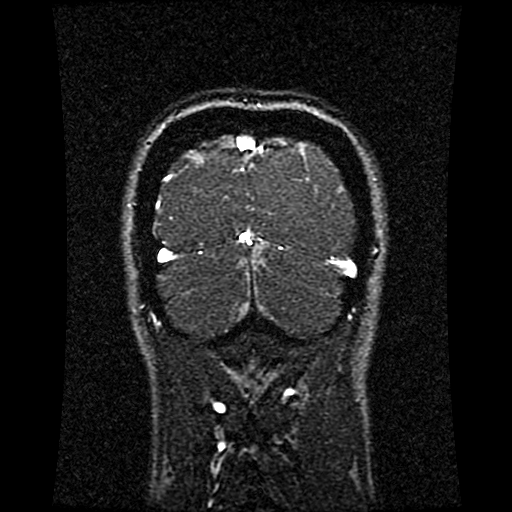
[im 46/136]
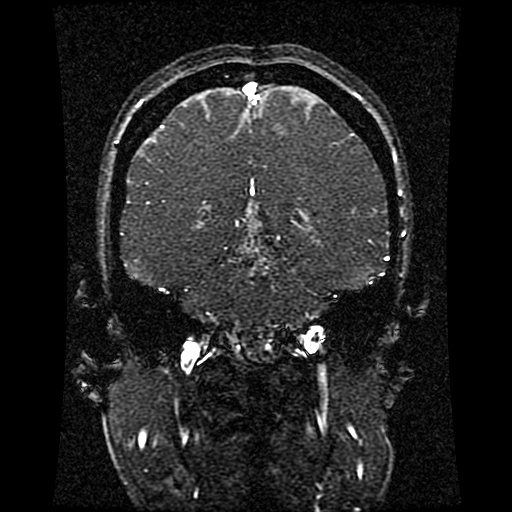
[im 68/136]
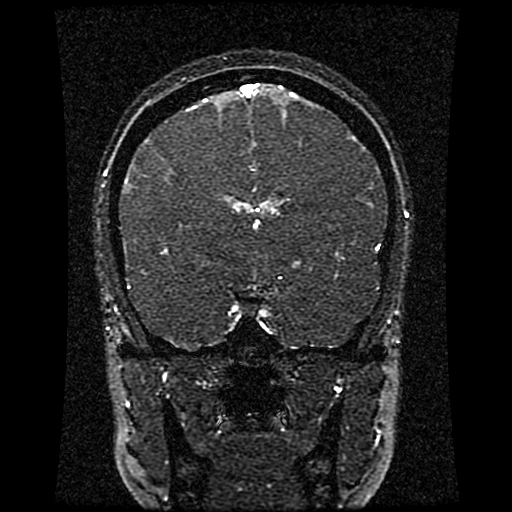
[im 91/136]
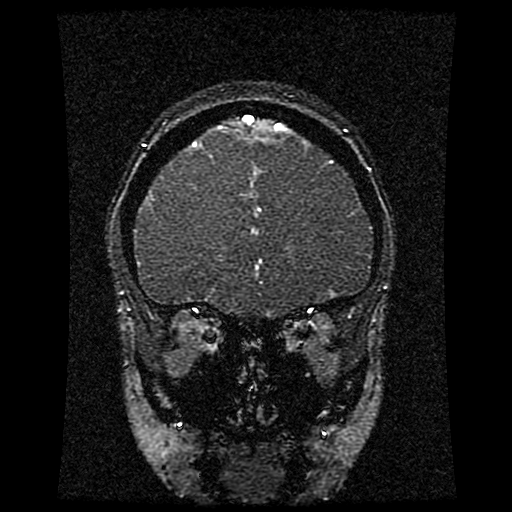
[im 113/136]
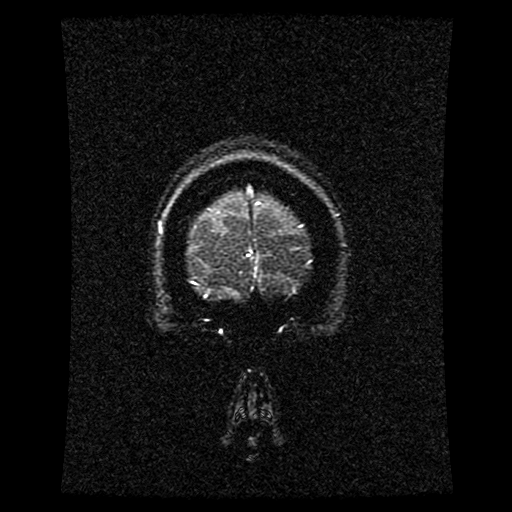
[im 136/136]
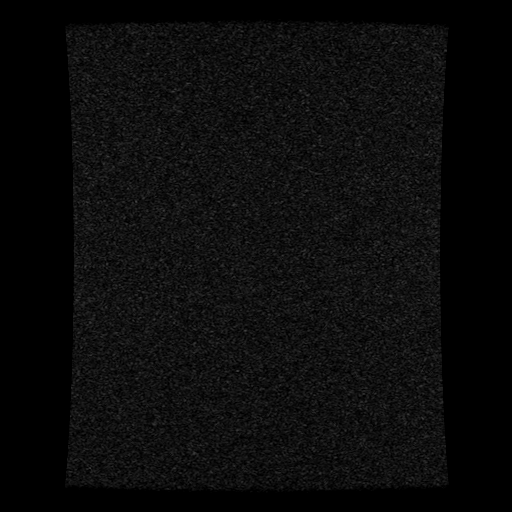

[Series 4: ax (id) 2 · axial · 1.0mm · 0.43mm/px · z∈[-132,-121]mm · 2 of 184 slices shown]
[im 1/184]
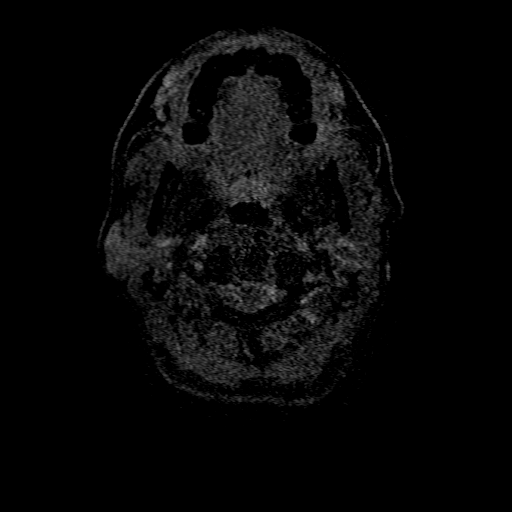
[im 23/184]
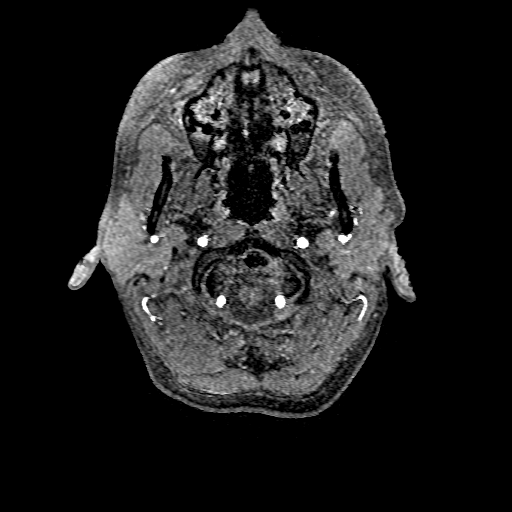

[Series 5: DWI · axial · 3.0mm · 0.94mm/px · z∈[-122,+18]mm · 5 of 100 slices shown (1 of 2)]
[im 1/100]
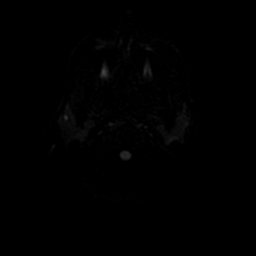
[im 25/100]
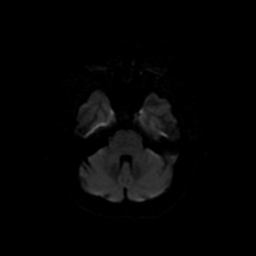
[im 50/100]
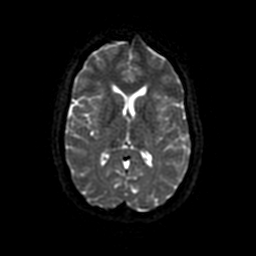
[im 75/100]
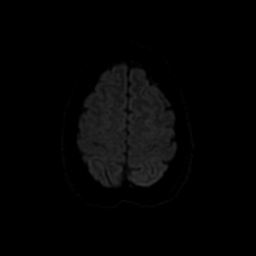
[im 100/100]
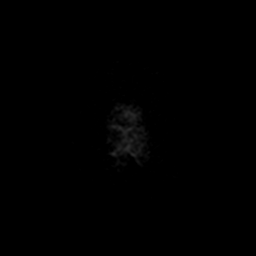

[Series 6: DWI · coronal · 4.0mm · 0.94mm/px · 4 of 72 slices shown (2 of 2)]
[im 1/72]
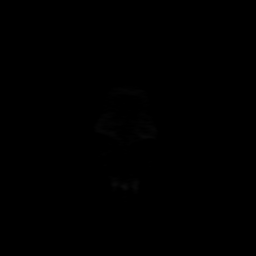
[im 24/72]
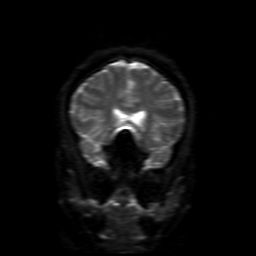
[im 48/72]
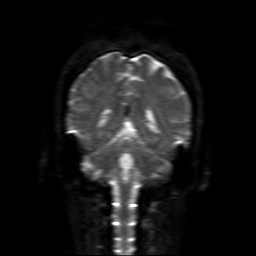
[im 72/72]
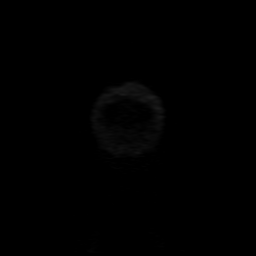

[Series 7: FLAIR · sagittal · 5.0mm · 0.47mm/px · 1 of 23 slices shown (1 of 2)]
[im 1/23]
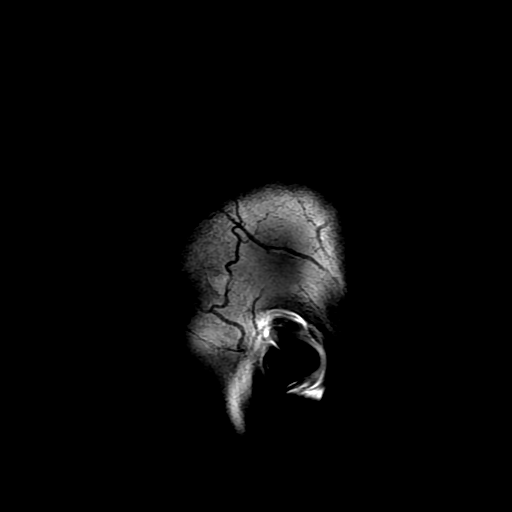

[Series 8: T2 · axial · 5.0mm · 0.47mm/px · 1 of 27 slices shown (1 of 2)]
[im 1/27]
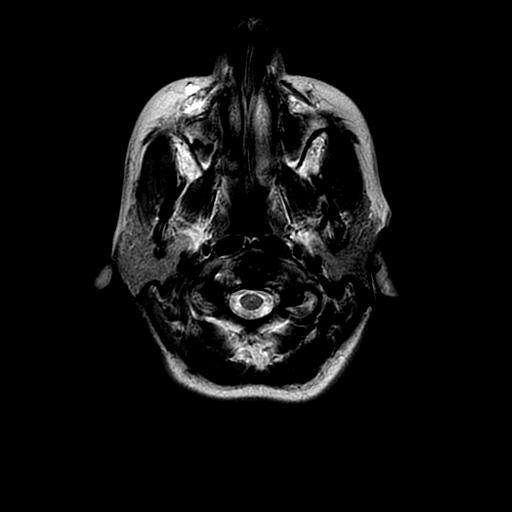

[Series 9: FLAIR · axial · 3.0mm · 0.47mm/px · 1 of 27 slices shown (2 of 2)]
[im 1/27]
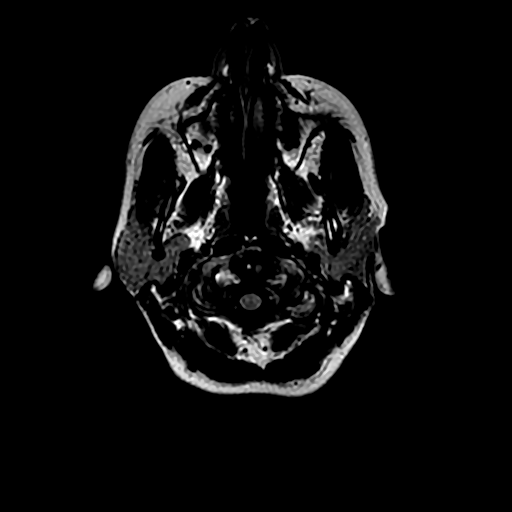

[Series 12: T2 · coronal · 5.0mm · 0.43mm/px · 2 of 30 slices shown (2 of 2)]
[im 1/30]
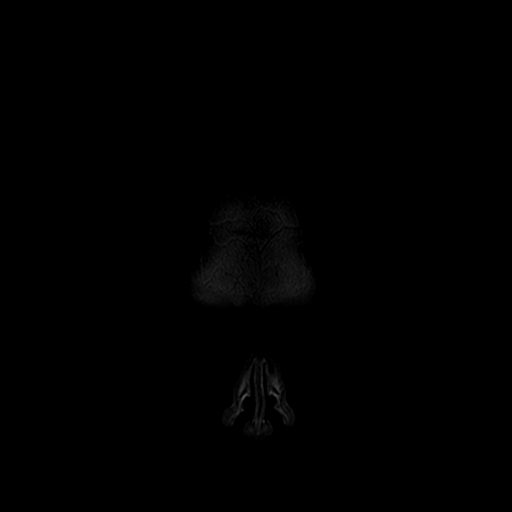
[im 30/30]
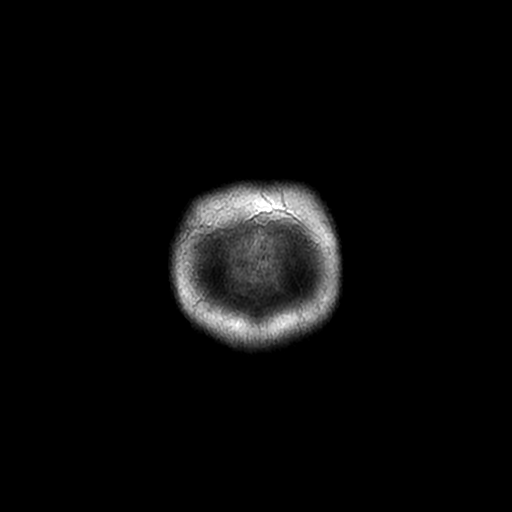

[Series 550: ADC · axial · 3.0mm · 0.94mm/px · z∈[-122,+18]mm · 3 of 50 slices shown (1 of 2)]
[im 1/50]
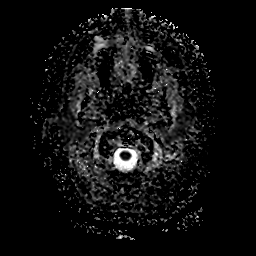
[im 25/50]
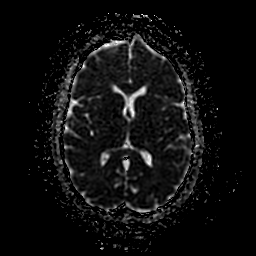
[im 50/50]
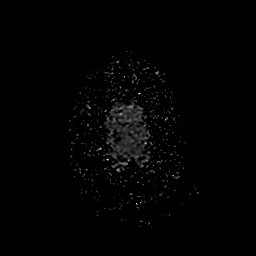

[Series 650: ADC · coronal · 4.0mm · 0.94mm/px · 2 of 36 slices shown (2 of 2)]
[im 1/36]
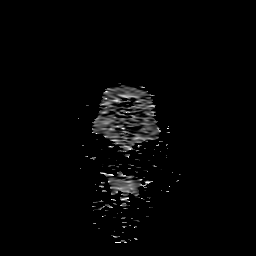
[im 36/36]
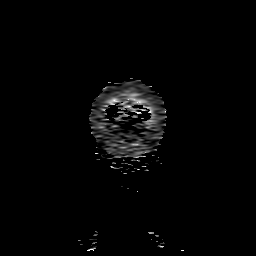

[28 of 48 positions shown; findings below may reference images not displayed]

FINDINGS: MRI HEAD FINDINGS

BRAIN: There is no acute infarct, acute hemorrhage or mass effect.
The midline structures are normal. There are no old infarcts.
Scattered foci of hyperintense T2-weighted signal within the white
matter in a nonspecific pattern that may be seen in the context of
migraine headaches, but is also seen in asymptomatic patients. The
CSF spaces are normal for age, with no hydrocephalus.
Susceptibility-sensitive sequences show no chronic microhemorrhage
or superficial siderosis.

SKULL AND UPPER CERVICAL SPINE: The visualized skull base,
calvarium, upper cervical spine and extracranial soft tissues are
normal.

SINUSES/ORBITS: No fluid levels or advanced mucosal thickening. No
mastoid or middle ear effusion. The orbits are normal.

MRA HEAD FINDINGS

Intracranial internal carotid arteries: Normal.

Anterior cerebral arteries: Normal.

Middle cerebral arteries: Normal.

Posterior communicating arteries: Not visualized

Posterior cerebral arteries: Normal.

Basilar artery: Normal.

Vertebral arteries: Left dominant. Normal.

Superior cerebellar arteries: Normal.

Inferior cerebellar arteries: Normal.

MRV HEAD FINDINGS

Superior sagittal sinus: Normal.

Straight sinus: Normal.

Inferior sagittal sinus, vein of [place] and internal cerebral veins:
Normal.

Transverse sinuses: Normal.

Sigmoid sinuses: Normal.

Visualized jugular veins: Normal.
IMPRESSION: 1. Nonspecific scattered foci of white matter hyperintensity. Though
this may be seen in the setting of migraine headaches or
vasculopathy such as early chronic small vessel disease, it is also
seen in normal patients of this age group.
2. Normal intracranial MRA/MRV.

## 2019-12-08 ENCOUNTER — Ambulatory Visit (INDEPENDENT_AMBULATORY_CARE_PROVIDER_SITE_OTHER): Payer: Managed Care, Other (non HMO) | Admitting: *Deleted

## 2019-12-08 ENCOUNTER — Other Ambulatory Visit: Payer: Self-pay

## 2019-12-08 DIAGNOSIS — L501 Idiopathic urticaria: Secondary | ICD-10-CM

## 2019-12-10 ENCOUNTER — Other Ambulatory Visit: Payer: Self-pay | Admitting: Neurology

## 2019-12-12 ENCOUNTER — Ambulatory Visit: Payer: Managed Care, Other (non HMO) | Admitting: Allergy and Immunology

## 2019-12-19 ENCOUNTER — Encounter: Payer: Self-pay | Admitting: Allergy and Immunology

## 2019-12-19 ENCOUNTER — Ambulatory Visit (INDEPENDENT_AMBULATORY_CARE_PROVIDER_SITE_OTHER): Payer: Managed Care, Other (non HMO) | Admitting: Allergy and Immunology

## 2019-12-19 ENCOUNTER — Other Ambulatory Visit: Payer: Self-pay

## 2019-12-19 VITALS — BP 122/72 | HR 75 | Temp 97.8°F | Resp 16 | Ht 62.75 in | Wt 149.4 lb

## 2019-12-19 DIAGNOSIS — T490X5A Adverse effect of local antifungal, anti-infective and anti-inflammatory drugs, initial encounter: Secondary | ICD-10-CM

## 2019-12-19 DIAGNOSIS — L308 Other specified dermatitis: Secondary | ICD-10-CM

## 2019-12-19 DIAGNOSIS — L710 Perioral dermatitis: Secondary | ICD-10-CM

## 2019-12-19 DIAGNOSIS — L501 Idiopathic urticaria: Secondary | ICD-10-CM | POA: Diagnosis not present

## 2019-12-19 DIAGNOSIS — L989 Disorder of the skin and subcutaneous tissue, unspecified: Secondary | ICD-10-CM

## 2019-12-19 DIAGNOSIS — L718 Other rosacea: Secondary | ICD-10-CM

## 2019-12-19 DIAGNOSIS — T63441D Toxic effect of venom of bees, accidental (unintentional), subsequent encounter: Secondary | ICD-10-CM

## 2019-12-19 MED ORDER — METRONIDAZOLE 0.75 % EX CREA: TOPICAL_CREAM | CUTANEOUS | 5 refills | Status: DC

## 2019-12-19 NOTE — Progress Notes (Signed)
[place] - [place] - [place]   Follow-up Note  Referring Provider: Maggie Schwalbe, PA-C Primary Provider: Waldemar Dickens Date of Office Visit: 12/19/2019  Subjective:   Christina Nixon (DOB: [birth year]) is a 49 y.o. female who returns to the [facility] [place] on 12/19/2019 in re-evaluation of the following:  HPI: [name] returns to this clinic in evaluation of chronic urticaria treated with omalizumab and a history of hymenoptera venom hypersensitivity state.  Her last visit to this clinic was 13 June 2019.  Her urticaria appears to be under very good control at this point in time on a combination of therapy including the use of omalizumab.  She did have some activity of her urticaria during December and January when she was under lots of stress because of her job but fortunately that appears to have resolved.  During her last visit she was developing a bruising dermatitis for which we had her discontinue over-the-counter supplements she was utilizing at that point in time and all of her bruising has resolved.  She relates a history of developing a dermatitis on her face over the course of the past 6 months.  This appears to occur at the bridge of her nose and around her mouth and she believes that it is secondary to the mask that she uses and some of the washing product she utilizes for the mask.  She has been using her mom's augmented betamethasone ointment on her face about 2 days every week for the past 6 months and she develops these intermittent flares of very red skin that appears to be bumpy.  She remains away from hymenoptera venom as best as possible and has an epinephrine delivery device.  Allergies as of 12/19/2019      Reactions   Aimovig [erenumab-aooe] Rash   Facial rash      Medication List      Ajovy 225 MG/1.5ML Sosy Generic drug: Fremanezumab-vfrm INJECT 225 MG INTO THE SKIN EVERY 30 (THIRTY) DAYS.  APPOINTMENT NEEDED FOR FURTHER REFILLS. PLEASE CALL [phone] TO SCHEDULE WITH NP.   amphetamine-dextroamphetamine 20 MG tablet Commonly known as: ADDERALL Take 40 mg by mouth daily.   Auvi-Q 0.3 mg/0.3 mL Soaj injection Generic drug: EPINEPHrine Use as directed for life threatening allergic reactions   BENADRYL ALLERGY PO Take by mouth as needed.   cetirizine 10 MG tablet Commonly known as: ZYRTEC Take 10 mg by mouth as needed for allergies.   Emgality 120 MG/ML Soaj Generic drug: Galcanezumab-gnlm Inject 120 mg into the skin every 30 (thirty) days.   escitalopram 10 MG tablet Commonly known as: LEXAPRO Take 10 mg by mouth daily.   famotidine 20 MG tablet Commonly known as: PEPCID Take 20 mg by mouth daily.   hydrOXYzine 25 MG tablet Commonly known as: ATARAX/VISTARIL TAKE 1 2 TABLETS BY MOUTH EVERY 4 6 HOURS AS NEEDED URTICARIA   levonorgestrel 20 MCG/24HR IUD Commonly known as: MIRENA 1 each by Intrauterine route once.   meloxicam 15 MG tablet Commonly known as: MOBIC TAKE 1 TABLET BY MOUTH EVERY DAY   montelukast 10 MG tablet Commonly known as: SINGULAIR TAKE ONE TABLET ONCE DAILY AS DIRECTED   Restasis 0.05 % ophthalmic emulsion Generic drug: cycloSPORINE INSTILL 1 DROP INTO BOTH EYES TWICE A DAY   SUMAtriptan 50 MG tablet Commonly known as: IMITREX TAKE 1 TABLET BY MOUTH AS NEEDED FOR MIGRAINE. MAY REPEAT IN 2 HRS IF HEADACHE PERSISTS OR RECURS. MAX 2  TABS/24 HOURS.   topiramate 100 MG tablet Commonly known as: TOPAMAX Take 1 tablet (100 mg total) by mouth at bedtime.   triamcinolone cream 0.1 % Commonly known as: KENALOG APPLY 3 TIMES DAILY AS NEEDED TO RASH       Past Medical History:  Diagnosis Date  . Migraine   . Urticaria     Past Surgical History:  Procedure Laterality Date  . THYROIDECTOMY, PARTIAL  1997    Review of systems negative except as noted in HPI / PMHx or noted below:  Review of Systems  Constitutional:  Negative.   HENT: Negative.   Eyes: Negative.   Respiratory: Negative.   Cardiovascular: Negative.   Gastrointestinal: Negative.   Genitourinary: Negative.   Musculoskeletal: Negative.   Skin: Negative.   Neurological: Negative.   Endo/Heme/Allergies: Negative.   Psychiatric/Behavioral: Negative.      Objective:   Vitals:   12/19/19 1721  BP: 122/72  Pulse: 75  Resp: 16  Temp: 97.8 F (36.6 C)  SpO2: 95%   Height: 5' 2.75" (159.4 cm)  Weight: 149 lb 6.4 oz (67.8 kg)   Physical Exam Constitutional:      Appearance: She is not diaphoretic.  HENT:     Head: Normocephalic.     Right Ear: Tympanic membrane, ear canal and external ear normal.     Left Ear: Tympanic membrane, ear canal and external ear normal.     Nose: Nose normal. No mucosal edema or rhinorrhea.     Mouth/Throat:     Pharynx: Uvula midline. No oropharyngeal exudate.  Eyes:     Conjunctiva/sclera: Conjunctivae normal.  Neck:     Thyroid: No thyromegaly.     Trachea: Trachea normal. No tracheal tenderness or tracheal deviation.  Cardiovascular:     Rate and Rhythm: Normal rate and regular rhythm.     Heart sounds: Normal heart sounds, S1 normal and S2 normal. No murmur.  Pulmonary:     Effort: No respiratory distress.     Breath sounds: Normal breath sounds. No stridor. No wheezing or rales.  Lymphadenopathy:     Head:     Right side of head: No tonsillar adenopathy.     Left side of head: No tonsillar adenopathy.     Cervical: No cervical adenopathy.  Skin:    Findings: Rash (Papular erythematous somewhat cystic dermatitis involving nasal bridge and perioral region) present. No erythema.     Nails: There is no clubbing.  Neurological:     Mental Status: She is alert.     Diagnostics: none  Assessment and Plan:   1. Idiopathic urticaria   2. Inflammatory dermatosis   3. 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 Systemic reaction to bee sting, accidental or  unintentional, subsequent encounter     1.  Cetirizine 10 mg - 1-2 tablets 1-2 times per day (max=40mg )  2.  Montelukast 10 mg - 1 tablet 1 time per day  3.  Omalizumab 300 mg every 4 weeks  5.  Can continue to add Benadryl or hydroxyzine as needed  6.  Auvi-Q 0.3, Benadryl, MD/ER evaluation for allergic reaction  7.  Attempt to discontinueFamotidine 20 mg   8.  Do not use ultra potent topical steroids on face  9.  MetroCream applied to face twice a day  10.  Skin dermatitis may be rosacea and require doxycycline if not responsive to MetroCream.  Contact clinic with update  11. Return to clinic in  6 months or earlier if problem  [name]'s urticaria appears to be under very good control and we will see if we can consolidate her therapy by discontinuing her H2 receptor blocker while she continues on omalizumab and cetirizine and montelukast.  She appears to have a very significant inflammatory dermatosis involving her face.  Whether this is rosacea or perioral dermatitis or a secondary steroid induced rosacea is unknown but we will treat her with MetroCream and have her discontinue her ultra potent topical steroid administration that she has been using from her mom's supply of medications.  Assuming she does well we will see her back in this clinic in 6 months or earlier if there is a problem.  [name], MD Allergy / Immunology [facility]

## 2019-12-19 NOTE — Patient Instructions (Addendum)
  1.  Cetirizine 10 mg - 1-2 tablets 1-2 times per day (max=40mg )  2.  Montelukast 10 mg - 1 tablet 1 time per day  3.  Omalizumab 300 mg every 4 weeks  5.  Can continue to add Benadryl or hydroxyzine as needed  6.  Auvi-Q 0.3, Benadryl, MD/ER evaluation for allergic reaction  7.  Attempt to discontinueFamotidine 20 mg   8.  Do not use ultra potent topical steroids on face  9.  MetroCream applied to face twice a day  10.  Skin dermatitis may be rosacea and require doxycycline if not responsive to MetroCream.  Contact clinic with update  11. Return to clinic in 6 months or earlier if problem

## 2019-12-20 ENCOUNTER — Encounter: Payer: Self-pay | Admitting: Allergy and Immunology

## 2019-12-22 ENCOUNTER — Telehealth: Payer: Self-pay | Admitting: Allergy and Immunology

## 2019-12-22 NOTE — Telephone Encounter (Signed)
Lets have her start doxycycline 50 mg 1 time per day continuous administration.  Lets make an appointment for her to be seen by [facility] dermatology for the presumptive diagnosis of rosacea.

## 2019-12-22 NOTE — Telephone Encounter (Signed)
Christina Nixon called in and states Dr. Lucie Leather gave her a cream to use on her face for her "mask rash"  Christina Nixon states it did not help and the rash has spread and is everywhere from her nose down to her chin.  Christina Nixon stated she has Hydroxyzine that Dr. Fatima Blank had prescribed her in the past and wants to know if she can take that to try to get rid of the rash, or is there something other than the cream she can use?  Christina Nixon states when she gets a call back we are ok to leave a message if she doesn't answer.  Please advise.

## 2019-12-22 NOTE — Telephone Encounter (Signed)
Left a message for Daksha to call tomorrow morning.

## 2019-12-23 ENCOUNTER — Other Ambulatory Visit: Payer: Self-pay | Admitting: *Deleted

## 2019-12-23 MED ORDER — DOXYCYCLINE HYCLATE 50 MG PO CAPS: ORAL_CAPSULE | ORAL | 3 refills | Status: DC

## 2019-12-23 NOTE — Telephone Encounter (Signed)
Spoke with Christina Nixon and let her know [facility]. She states that she will contact [facility] to schedule an appointment. I will fax Dr. Kathyrn Lass last office note.

## 2020-01-04 ENCOUNTER — Other Ambulatory Visit: Payer: Self-pay

## 2020-01-04 ENCOUNTER — Ambulatory Visit (INDEPENDENT_AMBULATORY_CARE_PROVIDER_SITE_OTHER): Payer: Managed Care, Other (non HMO) | Admitting: *Deleted

## 2020-01-04 DIAGNOSIS — L501 Idiopathic urticaria: Secondary | ICD-10-CM

## 2020-01-05 ENCOUNTER — Ambulatory Visit: Payer: Managed Care, Other (non HMO)

## 2020-02-01 ENCOUNTER — Other Ambulatory Visit: Payer: Self-pay

## 2020-02-01 ENCOUNTER — Ambulatory Visit (INDEPENDENT_AMBULATORY_CARE_PROVIDER_SITE_OTHER): Payer: Managed Care, Other (non HMO) | Admitting: *Deleted

## 2020-02-01 DIAGNOSIS — L501 Idiopathic urticaria: Secondary | ICD-10-CM

## 2020-02-13 ENCOUNTER — Other Ambulatory Visit: Payer: Self-pay | Admitting: Allergy and Immunology

## 2020-02-29 ENCOUNTER — Ambulatory Visit: Payer: Managed Care, Other (non HMO)

## 2020-02-29 ENCOUNTER — Ambulatory Visit (INDEPENDENT_AMBULATORY_CARE_PROVIDER_SITE_OTHER): Payer: Managed Care, Other (non HMO) | Admitting: *Deleted

## 2020-02-29 ENCOUNTER — Other Ambulatory Visit: Payer: Self-pay

## 2020-02-29 DIAGNOSIS — L501 Idiopathic urticaria: Secondary | ICD-10-CM | POA: Diagnosis not present

## 2020-03-07 ENCOUNTER — Other Ambulatory Visit: Payer: Self-pay | Admitting: Allergy and Immunology

## 2020-03-28 ENCOUNTER — Ambulatory Visit: Payer: Managed Care, Other (non HMO)

## 2020-03-29 ENCOUNTER — Ambulatory Visit (INDEPENDENT_AMBULATORY_CARE_PROVIDER_SITE_OTHER): Payer: Managed Care, Other (non HMO) | Admitting: *Deleted

## 2020-03-29 ENCOUNTER — Other Ambulatory Visit: Payer: Self-pay

## 2020-03-29 DIAGNOSIS — L501 Idiopathic urticaria: Secondary | ICD-10-CM

## 2020-04-26 ENCOUNTER — Other Ambulatory Visit: Payer: Self-pay

## 2020-04-26 ENCOUNTER — Ambulatory Visit (INDEPENDENT_AMBULATORY_CARE_PROVIDER_SITE_OTHER): Payer: Managed Care, Other (non HMO)

## 2020-04-26 DIAGNOSIS — L501 Idiopathic urticaria: Secondary | ICD-10-CM

## 2020-05-18 ENCOUNTER — Encounter: Payer: Self-pay | Admitting: Sports Medicine

## 2020-05-18 ENCOUNTER — Ambulatory Visit (INDEPENDENT_AMBULATORY_CARE_PROVIDER_SITE_OTHER): Payer: Managed Care, Other (non HMO)

## 2020-05-18 ENCOUNTER — Ambulatory Visit: Payer: Managed Care, Other (non HMO) | Admitting: Sports Medicine

## 2020-05-18 ENCOUNTER — Other Ambulatory Visit: Payer: Self-pay

## 2020-05-18 DIAGNOSIS — M79671 Pain in right foot: Secondary | ICD-10-CM

## 2020-05-18 DIAGNOSIS — M792 Neuralgia and neuritis, unspecified: Secondary | ICD-10-CM

## 2020-05-18 DIAGNOSIS — M7989 Other specified soft tissue disorders: Secondary | ICD-10-CM

## 2020-05-18 DIAGNOSIS — B351 Tinea unguium: Secondary | ICD-10-CM | POA: Diagnosis not present

## 2020-05-18 MED ORDER — GABAPENTIN 300 MG PO CAPS: 300.0000 mg | ORAL_CAPSULE | Freq: Every day | ORAL | 3 refills | Status: DC

## 2020-05-18 NOTE — Progress Notes (Signed)
Subjective: Christina Nixon is a 49 y.o. female patient seen today in office with complaint of 1.  Mildly painful thickened and discolored right great toenail.  Patient is desiring treatment for nail changes; has tried OTC topicals/Medication in the past with any improvement. Reports that nail is becoming difficult to manage because of the thickness and the lifting that is happening at this toenail. 2.  A growth along the side of the knuckle joint for the last month denies any injury and reports that the lesion is staying about the same size over the side of her knuckle joint but what has changed is it feels like it is moved a little bit down and also there is increase in numbness and constant tingling to her great toe as well as a little bit of tingling to her second toe.  Patient denies any trauma or injury to this foot or any change with activity or shoes that could be contributing. Patient has no other pedal complaints at this time.   Review of systems noncontributory  Patient Active Problem List   Diagnosis Date Noted  . Migraine without aura and with status migrainosus, not intractable 07/19/2018    Current Outpatient Medications on File Prior to Visit  Medication Sig Dispense Refill  . AJOVY 225 MG/1.5ML SOSY INJECT 225 MG INTO THE SKIN EVERY 30 (THIRTY) DAYS. APPOINTMENT NEEDED FOR FURTHER REFILLS. PLEASE CALL [phone] TO SCHEDULE WITH NP. 1.5 mL 11  . amphetamine-dextroamphetamine (ADDERALL) 20 MG tablet Take 40 mg by mouth daily.    Marland Kitchen AUVI-Q 0.3 MG/0.3ML SOAJ injection Use as directed for life threatening allergic reactions 2 each 3  . cetirizine (ZYRTEC) 10 MG tablet Take 10 mg by mouth as needed for allergies.    . diphenhydrAMINE HCl (BENADRYL ALLERGY PO) Take by mouth as needed.    . doxycycline (VIBRAMYCIN) 50 MG capsule Take one capsule once daily as directed 30 capsule 3  . escitalopram (LEXAPRO) 10 MG tablet Take 10 mg by mouth daily.    . famotidine (PEPCID) 20 MG tablet  Take 20 mg by mouth daily.     . Galcanezumab-gnlm (EMGALITY) 120 MG/ML SOAJ Inject 120 mg into the skin every 30 (thirty) days. 1 pen 11  . hydrOXYzine (ATARAX/VISTARIL) 25 MG tablet TAKE 1 2 TABLETS BY MOUTH EVERY 4 6 HOURS AS NEEDED URTICARIA    . levonorgestrel (MIRENA) 20 MCG/24HR IUD 1 each by Intrauterine route once.    . meloxicam (MOBIC) 15 MG tablet TAKE 1 TABLET BY MOUTH EVERY DAY (Patient taking differently: Take 15 mg by mouth daily as needed. ) 90 tablet 0  . metroNIDAZOLE (METROCREAM) 0.75 % cream Apply to face twice daily 45 g 5  . montelukast (SINGULAIR) 10 MG tablet TAKE ONE TABLET ONCE DAILY AS DIRECTED 90 tablet 1  . RESTASIS 0.05 % ophthalmic emulsion INSTILL 1 DROP INTO BOTH EYES TWICE A DAY    . SUMAtriptan (IMITREX) 50 MG tablet TAKE 1 TABLET BY MOUTH AS NEEDED FOR MIGRAINE. MAY REPEAT IN 2 HRS IF HEADACHE PERSISTS OR RECURS. MAX 2 TABS/24 HOURS. 9 tablet 2  . topiramate (TOPAMAX) 100 MG tablet Take 1 tablet (100 mg total) by mouth at bedtime. 90 tablet 3  . triamcinolone cream (KENALOG) 0.1 % APPLY 3 TIMES DAILY AS NEEDED TO RASH    . XOLAIR 150 MG injection INJECT 300 MG UNDER THE SKIN EVERY 4 WEEKS 1 each 11   Current Facility-Administered Medications on File Prior to Visit  Medication Dose Route  Frequency Provider Last Rate Last Admin  . omalizumab Geoffry Paradise) injection 300 mg  300 mg Subcutaneous Q28 days Jessica Priest, MD   300 mg at 04/26/20 0915    Allergies  Allergen Reactions  . Aimovig [Erenumab-Aooe] Rash    Facial rash    Objective: Physical Exam  General: Well developed, nourished, no acute distress, awake, alert and oriented x 3  Vascular: Dorsalis pedis artery 2/4 bilateral, Posterior tibial artery 1/4 bilateral, skin temperature warm to warm proximal to distal bilateral lower extremities, no varicosities, pedal hair present bilateral.  Neurological: Gross sensation present via light touch bilateral.  Subjective tingling to first and second toes on  the right.  Dermatological: Skin is warm, dry, and supple bilateral, right hallux nail is minimally tender, short thick, and discolored with mild subungal debris and lifting distally, no webspace macerations present bilateral, no open lesions present bilateral, no callus/corns/hyperkeratotic tissue present bilateral. + raised soft tissue mass at medial hallux IPJ on right that measures 0.5x0.5cm nonpulsatile nonpainful likely consistent with cyst of joint or tendon.  No signs of infection bilateral.  Musculoskeletal: Mild decreased and first metatarsophalangeal joint range of motion right greater than left there is 50 degrees of dorsiflexion and 10 degrees of plantarflexion at the right first metatarsophalangeal joint. Muscular strength within normal limits without pain on range of motion. No pain with calf compression bilateral.  Assessment and Plan:  Problem List Items Addressed This Visit    None    Visit Diagnoses    Onychomycosis    -  Primary   Relevant Orders   Culture, fungus without smear   Right foot pain       Relevant Orders   DG Foot Complete Right   Soft tissue mass       Relevant Orders   [place] Extrem Low Right Ltd   Neuritis          -Examined patient -Discussed treatment options for likely soft tissue mass versus cyst at right great toe joint at this time we will order [facility] ultrasound for further evaluation -Discussed treatment options for neuritis which could be structural related to rubbing versus impingement from soft tissue mass prescribed gabapentin for patient to take if symptoms continue to persist -Discussed treatment options for painful dystrophic nails  -Fungal culture was obtained by removing a portion of the hard nail itself from each of the involved right great toenail using a sterile nail nipper and sent to [facility] lab. Patient tolerated the biopsy procedure well without discomfort or need for anesthesia.  -Patient to return in 3 to 4 weeks for follow up  evaluation and discussion of fungal culture results or sooner if symptoms worsen.  Asencion Islam, DPM

## 2020-05-23 ENCOUNTER — Telehealth: Payer: Self-pay

## 2020-05-23 NOTE — Telephone Encounter (Signed)
-----   Message from Asencion Islam, [place] sent at 05/18/2020  5:48 PM EDT ----- Regarding: [place] order Aggie Moats will fax order form on Monday

## 2020-05-23 NOTE — Telephone Encounter (Signed)
Contacted [facility]  to follow up on pt's [place] appt. Marisue Ivan from [place] stated the've received the order and they have been trying to contact the pt to schedule her appt but pt does not answer and her voice mail box is full

## 2020-05-24 ENCOUNTER — Ambulatory Visit: Payer: Self-pay

## 2020-05-28 ENCOUNTER — Other Ambulatory Visit: Payer: Self-pay

## 2020-05-28 ENCOUNTER — Ambulatory Visit (INDEPENDENT_AMBULATORY_CARE_PROVIDER_SITE_OTHER): Payer: Managed Care, Other (non HMO) | Admitting: *Deleted

## 2020-05-28 ENCOUNTER — Other Ambulatory Visit: Payer: Self-pay | Admitting: Sports Medicine

## 2020-05-28 DIAGNOSIS — L501 Idiopathic urticaria: Secondary | ICD-10-CM

## 2020-05-28 DIAGNOSIS — M792 Neuralgia and neuritis, unspecified: Secondary | ICD-10-CM

## 2020-05-28 DIAGNOSIS — M7989 Other specified soft tissue disorders: Secondary | ICD-10-CM

## 2020-05-30 ENCOUNTER — Telehealth: Payer: Self-pay | Admitting: Podiatry

## 2020-05-30 NOTE — Telephone Encounter (Signed)
Patient LVM. Scheduled for MRI tomorrow (Friday) at 3 pm. She has to pay $500-600 out of pocket. Wants to discuss plan for cyst with Dr. Marylene Land before she proceeds with MRI.

## 2020-05-31 ENCOUNTER — Encounter: Payer: Self-pay | Admitting: Sports Medicine

## 2020-05-31 NOTE — Telephone Encounter (Signed)
I called patient and reviewed with her the purpose of the ultrasound to further investigate the mass of her foot. Patient expressed understanding and will go get it done today at 3pm and has an appt on 9/8 already to review the results with me. -Dr. Kathie Rhodes

## 2020-06-06 ENCOUNTER — Ambulatory Visit: Payer: Managed Care, Other (non HMO) | Admitting: Sports Medicine

## 2020-06-06 ENCOUNTER — Other Ambulatory Visit: Payer: Self-pay

## 2020-06-06 ENCOUNTER — Encounter: Payer: Self-pay | Admitting: Sports Medicine

## 2020-06-06 DIAGNOSIS — M6740 Ganglion, unspecified site: Secondary | ICD-10-CM | POA: Diagnosis not present

## 2020-06-06 DIAGNOSIS — M2140 Flat foot [pes planus] (acquired), unspecified foot: Secondary | ICD-10-CM

## 2020-06-06 DIAGNOSIS — M792 Neuralgia and neuritis, unspecified: Secondary | ICD-10-CM | POA: Diagnosis not present

## 2020-06-06 DIAGNOSIS — M7989 Other specified soft tissue disorders: Secondary | ICD-10-CM | POA: Diagnosis not present

## 2020-06-06 DIAGNOSIS — G5751 Tarsal tunnel syndrome, right lower limb: Secondary | ICD-10-CM

## 2020-06-06 DIAGNOSIS — L603 Nail dystrophy: Secondary | ICD-10-CM

## 2020-06-06 NOTE — Progress Notes (Signed)
Subjective: Christina Nixon is a 49 y.o. female patient seen today 1.  Follow-up evaluation and discussion of ultrasound results 2.  Discussion of fungal culture nail results 3.  Follow-up discussion of numbness to toes reports that the numbness has increased and now is now only at the first toe but at all toes on the right foot.  Patient denies any other changes since last encounter.  No other issues noted.  Patient Active Problem List   Diagnosis Date Noted   Migraine without aura and with status migrainosus, not intractable 07/19/2018    Current Outpatient Medications on File Prior to Visit  Medication Sig Dispense Refill   AJOVY 225 MG/1.5ML SOSY INJECT 225 MG INTO THE SKIN EVERY 30 (THIRTY) DAYS. APPOINTMENT NEEDED FOR FURTHER REFILLS. PLEASE CALL [phone] TO SCHEDULE WITH NP. 1.5 mL 11   amphetamine-dextroamphetamine (ADDERALL) 20 MG tablet Take 40 mg by mouth daily.     AUVI-Q 0.3 MG/0.3ML SOAJ injection Use as directed for life threatening allergic reactions 2 each 3   cetirizine (ZYRTEC) 10 MG tablet Take 10 mg by mouth as needed for allergies.     diphenhydrAMINE HCl (BENADRYL ALLERGY PO) Take by mouth as needed.     doxycycline (VIBRAMYCIN) 50 MG capsule Take one capsule once daily as directed 30 capsule 3   escitalopram (LEXAPRO) 10 MG tablet Take 10 mg by mouth daily.     famotidine (PEPCID) 20 MG tablet Take 20 mg by mouth daily.      gabapentin (NEURONTIN) 300 MG capsule Take 1 capsule (300 mg total) by mouth at bedtime. 90 capsule 3   Galcanezumab-gnlm (EMGALITY) 120 MG/ML SOAJ Inject 120 mg into the skin every 30 (thirty) days. 1 pen 11   hydrOXYzine (ATARAX/VISTARIL) 25 MG tablet TAKE 1 2 TABLETS BY MOUTH EVERY 4 6 HOURS AS NEEDED URTICARIA     levonorgestrel (MIRENA) 20 MCG/24HR IUD 1 each by Intrauterine route once.     meloxicam (MOBIC) 15 MG tablet TAKE 1 TABLET BY MOUTH EVERY DAY (Patient taking differently: Take 15 mg by mouth daily as needed. ) 90  tablet 0   metroNIDAZOLE (METROCREAM) 0.75 % cream Apply to face twice daily 45 g 5   montelukast (SINGULAIR) 10 MG tablet TAKE ONE TABLET ONCE DAILY AS DIRECTED 90 tablet 1   RESTASIS 0.05 % ophthalmic emulsion INSTILL 1 DROP INTO BOTH EYES TWICE A DAY     SUMAtriptan (IMITREX) 50 MG tablet TAKE 1 TABLET BY MOUTH AS NEEDED FOR MIGRAINE. MAY REPEAT IN 2 HRS IF HEADACHE PERSISTS OR RECURS. MAX 2 TABS/24 HOURS. 9 tablet 2   topiramate (TOPAMAX) 100 MG tablet Take 1 tablet (100 mg total) by mouth at bedtime. 90 tablet 3   triamcinolone cream (KENALOG) 0.1 % APPLY 3 TIMES DAILY AS NEEDED TO RASH     XOLAIR 150 MG injection INJECT 300 MG UNDER THE SKIN EVERY 4 WEEKS 1 each 11   Current Facility-Administered Medications on File Prior to Visit  Medication Dose Route Frequency Provider Last Rate Last Admin   omalizumab Geoffry Paradise) injection 300 mg  300 mg Subcutaneous Q28 days Kozlow, Alvira Philips, MD   300 mg at 05/28/20 1820    Allergies  Allergen Reactions   Aimovig [Erenumab-Aooe] Rash    Facial rash    Objective: Physical Exam  General: Well developed, nourished, no acute distress, awake, alert and oriented x 3  Vascular: Dorsalis pedis artery 2/4 bilateral, Posterior tibial artery 1/4 bilateral, skin temperature warm to warm proximal  to distal bilateral lower extremities, no varicosities, pedal hair present bilateral.  Neurological: Gross sensation present via light touch bilateral.  Subjective tingling to now all toes on the right foot..  Dermatological: Skin is warm, dry, and supple bilateral, right hallux nail is minimally tender, short thick, and discolored with mild subungal debris and lifting distally, no webspace macerations present bilateral, no open lesions present bilateral, no callus/corns/hyperkeratotic tissue present bilateral. + raised soft tissue mass at medial hallux IPJ on right that measures 0.5x0.5cm nonpulsatile nonpainful likely consistent with cyst unchanged in size  from previous.  No signs of infection bilateral.  Musculoskeletal: Mild decreased and first metatarsophalangeal joint range of motion right greater than left there is 50 degrees of dorsiflexion and 10 degrees of plantarflexion at the right first metatarsophalangeal joint. Muscular strength within normal limits without pain on range of motion. No pain with calf compression bilateral.  Assessment and Plan:  Problem List Items Addressed This Visit    None    Visit Diagnoses    Soft tissue mass    -  Primary   Ganglion cyst       Neuritis       Pes planus, unspecified laterality       Tarsal tunnel syndrome of right side       Nail dystrophy          -Examined patient -Discussed with patient ultrasound results which is consistent with ganglion cyst or adventitial bursa.  At this time patient will continue with monitoring and decide at a later time when she wants to move forward with surgical excision -Discussed with patient fungal culture results which are negative and suggest microtrauma recommend patient to continue with daily trimming and filing and advised patient to purchase toecyln topical to use as instructed to toenails daily -Advised patient to try topical pain rub for numbness and to use at medial ankle to see if this will give her some relief says numbness has progressed to involve all toes I am highly suspecting tarsal tunnel syndrome with unsure mechanical component versus localized impingement -Patient to return to office if symptoms fail to continue to improve -I spent extensive time with patient over 35 minutes discussing the results and allowing patient to take notes and ask questions during this encounter  Asencion Islam, DPM

## 2020-06-18 ENCOUNTER — Ambulatory Visit (INDEPENDENT_AMBULATORY_CARE_PROVIDER_SITE_OTHER): Payer: Managed Care, Other (non HMO) | Admitting: Allergy and Immunology

## 2020-06-18 ENCOUNTER — Encounter: Payer: Self-pay | Admitting: Allergy and Immunology

## 2020-06-18 ENCOUNTER — Other Ambulatory Visit: Payer: Self-pay

## 2020-06-18 VITALS — BP 118/70 | HR 64 | Resp 18

## 2020-06-18 DIAGNOSIS — T63481D Toxic effect of venom of other arthropod, accidental (unintentional), subsequent encounter: Secondary | ICD-10-CM

## 2020-06-18 DIAGNOSIS — L718 Other rosacea: Secondary | ICD-10-CM

## 2020-06-18 DIAGNOSIS — L501 Idiopathic urticaria: Secondary | ICD-10-CM

## 2020-06-18 DIAGNOSIS — T490X5D Adverse effect of local antifungal, anti-infective and anti-inflammatory drugs, subsequent encounter: Secondary | ICD-10-CM | POA: Diagnosis not present

## 2020-06-18 DIAGNOSIS — T490X5A Adverse effect of local antifungal, anti-infective and anti-inflammatory drugs, initial encounter: Secondary | ICD-10-CM

## 2020-06-18 DIAGNOSIS — T63481A Toxic effect of venom of other arthropod, accidental (unintentional), initial encounter: Secondary | ICD-10-CM

## 2020-06-18 MED ORDER — AUVI-Q 0.3 MG/0.3ML IJ SOAJ: INTRAMUSCULAR | 3 refills | Status: DC

## 2020-06-18 NOTE — Progress Notes (Signed)
[place] - [place] - [place] - [place] - [place]   Follow-up Note  Referring Provider: Leane Call, PA-C Primary Provider: Augustine Radar Date of Office Visit: 06/18/2020  Subjective:   Christina Nixon (DOB: 27-Aug-1971) is a 49 y.o. 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  Her last visit to this clinic was 12/19/2019.  When we last saw her in this clinic she was having what appeared to be topical steroid induced facial rosacea and I asked her to stop her betamethasone ointment on her face and start MetroCream and doxycycline which completely cleared up her face.  Her urticaria has been under excellent control with the use of omalizumab and her cetirizine and montelukast.  She did have a flare of her urticaria after receiving her second Pfizer Covid vaccine.  This lasted 3 days or so.  She remains away from hymenoptera venom as best as possible and has a injectable epinephrine device.  Allergies as of 06/18/2020      Reactions   Aimovig [erenumab-aooe] Rash   Facial rash      Medication List    Ajovy 225 MG/1.5ML Sosy Generic drug: Fremanezumab-vfrm INJECT 225 MG INTO THE SKIN EVERY 30 (THIRTY) DAYS. APPOINTMENT NEEDED FOR FURTHER REFILLS. PLEASE CALL [phone] TO SCHEDULE WITH NP.   amphetamine-dextroamphetamine 20 MG tablet Commonly known as: ADDERALL Take 40 mg by mouth daily.   Auvi-Q 0.3 mg/0.3 mL Soaj injection Generic drug: EPINEPHrine Use as directed for life threatening allergic reactions   BENADRYL ALLERGY PO Take by mouth as needed.   cetirizine 10 MG tablet Commonly known as: ZYRTEC Take 10 mg by mouth as needed for allergies.   doxycycline 50 MG capsule Commonly known as:  VIBRAMYCIN Take one capsule once daily as directed   escitalopram 10 MG tablet Commonly known as: LEXAPRO Take 10 mg by mouth daily.   gabapentin 300 MG capsule Commonly known as: NEURONTIN Take 1 capsule (300 mg total) by mouth at bedtime.   hydrOXYzine 25 MG tablet Commonly known as: ATARAX/VISTARIL TAKE 1 2 TABLETS BY MOUTH EVERY 4 6 HOURS AS NEEDED URTICARIA   levonorgestrel 20 MCG/24HR IUD Commonly known as: MIRENA 1 each by Intrauterine route once.   meloxicam 15 MG tablet Commonly known as: MOBIC TAKE 1 TABLET BY MOUTH EVERY DAY   metroNIDAZOLE 0.75 % cream Commonly known as: METROCREAM Apply to face twice daily   montelukast 10 MG tablet Commonly known as: SINGULAIR TAKE ONE TABLET ONCE DAILY AS DIRECTED   Restasis 0.05 % ophthalmic emulsion Generic drug: cycloSPORINE INSTILL 1 DROP INTO BOTH EYES TWICE A DAY   SUMAtriptan 50 MG tablet Commonly known as: IMITREX TAKE 1 TABLET BY MOUTH AS NEEDED FOR MIGRAINE. MAY REPEAT IN 2 HRS IF HEADACHE PERSISTS OR RECURS. MAX 2 TABS/24 HOURS.   triamcinolone cream 0.1 % Commonly known as: KENALOG APPLY 3 TIMES DAILY AS NEEDED TO RASH   Xolair 150 MG injection Generic drug: omalizumab INJECT 300 MG UNDER THE SKIN EVERY 4 WEEKS       Past Medical History:  Diagnosis Date  . Migraine   . Urticaria     Past Surgical History:  Procedure Laterality Date  . THYROIDECTOMY, PARTIAL  1997    Review of systems negative except as noted in HPI /  PMHx or noted below:  Review of Systems  Constitutional: Negative.   HENT: Negative.   Eyes: Negative.   Respiratory: Negative.   Cardiovascular: Negative.   Gastrointestinal: Negative.   Genitourinary: Negative.   Musculoskeletal: Negative.   Skin: Negative.   Neurological: Negative.   Endo/Heme/Allergies: Negative.   Psychiatric/Behavioral: Negative.      Objective:   Vitals:   06/18/20 1704  BP: 118/70  Pulse: 64  Resp: 18  SpO2: 96%           Physical Exam Constitutional:      Appearance: She is not diaphoretic.  HENT:     Head: Normocephalic.     Right Ear: Tympanic membrane, ear canal and external ear normal.     Left Ear: Tympanic membrane, ear canal and external ear normal.     Nose: Nose normal. No mucosal edema or rhinorrhea.     Mouth/Throat:     Pharynx: Uvula midline. No oropharyngeal exudate.  Eyes:     Conjunctiva/sclera: Conjunctivae normal.  Neck:     Thyroid: No thyromegaly.     Trachea: Trachea normal. No tracheal tenderness or tracheal deviation.  Cardiovascular:     Rate and Rhythm: Normal rate and regular rhythm.     Heart sounds: Normal heart sounds, S1 normal and S2 normal. No murmur heard.   Pulmonary:     Effort: No respiratory distress.     Breath sounds: Normal breath sounds. No stridor. No wheezing or rales.  Lymphadenopathy:     Head:     Right side of head: No tonsillar adenopathy.     Left side of head: No tonsillar adenopathy.     Cervical: No cervical adenopathy.  Skin:    Findings: No erythema or rash.     Nails: There is no clubbing.  Neurological:     Mental Status: She is alert.     Diagnostics: none  Assessment and Plan:   1. 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  Cetirizine 10 mg - 1-2 tablets 1-2 times per day (max=40mg )  2.  Montelukast 10 mg - 1 tablet 1 time per day  3.  Omalizumab 300 mg every 4 weeks  4.  Can continue to add Benadryl or hydroxyzine as needed  5.  Auvi-Q 0.3, Benadryl, MD/ER evaluation for allergic reaction  6.  MetroCream applied to face twice a day if needed  7. Obtain fall flu vaccine   8. Return to clinic in 12 months or earlier if problem   Christina Nixon is doing very well and she will continue on the therapy noted above which includes omalizumab and she has the option of utilizing other medications including MetroCream if required and I will see her back in  his clinic in 1 year or earlier if there is a problem.  [name], MD Allergy / Immunology [facility]

## 2020-06-18 NOTE — Patient Instructions (Addendum)
  1.  Cetirizine 10 mg - 1-2 tablets 1-2 times per day (max=40mg )  2.  Montelukast 10 mg - 1 tablet 1 time per day  3.  Omalizumab 300 mg every 4 weeks  4.  Can continue to add Benadryl or hydroxyzine as needed  5.  Auvi-Q 0.3, Benadryl, MD/ER evaluation for allergic reaction  6.  MetroCream applied to face twice a day if needed  7. Obtain fall flu vaccine   8. Return to clinic in 12 months or earlier if problem

## 2020-06-19 ENCOUNTER — Encounter: Payer: Self-pay | Admitting: Allergy and Immunology

## 2020-06-25 ENCOUNTER — Ambulatory Visit (INDEPENDENT_AMBULATORY_CARE_PROVIDER_SITE_OTHER): Payer: Managed Care, Other (non HMO)

## 2020-06-25 ENCOUNTER — Other Ambulatory Visit: Payer: Self-pay

## 2020-06-25 DIAGNOSIS — L501 Idiopathic urticaria: Secondary | ICD-10-CM

## 2020-07-06 ENCOUNTER — Encounter: Payer: Self-pay | Admitting: Sports Medicine

## 2020-07-06 ENCOUNTER — Ambulatory Visit: Payer: Managed Care, Other (non HMO) | Admitting: Sports Medicine

## 2020-07-06 ENCOUNTER — Other Ambulatory Visit: Payer: Self-pay

## 2020-07-06 DIAGNOSIS — M6740 Ganglion, unspecified site: Secondary | ICD-10-CM | POA: Diagnosis not present

## 2020-07-06 DIAGNOSIS — M79671 Pain in right foot: Secondary | ICD-10-CM | POA: Diagnosis not present

## 2020-07-06 DIAGNOSIS — M792 Neuralgia and neuritis, unspecified: Secondary | ICD-10-CM

## 2020-07-06 DIAGNOSIS — M7989 Other specified soft tissue disorders: Secondary | ICD-10-CM

## 2020-07-06 NOTE — Progress Notes (Signed)
Subjective: Christina Nixon is a 49 y.o. female patient seen today 1.  Follow-up evaluation of cyst at right 1st toe 2.  Follow-up discussion of numbness to toes mostly 1-2 on right and 3. Surgery consult.  Patient denies any other changes since last encounter.  No other issues noted.  Patient Active Problem List   Diagnosis Date Noted  . Migraine without aura and with status migrainosus, not intractable 07/19/2018  . New daily persistent headache 07/05/2018  . Attention deficit disorder (ADD) without hyperactivity 03/05/2018  . Essential hypertension 03/05/2018  . Family history of MI (myocardial infarction) 03/05/2018  . Atypical chest pain 01/10/2018  . Breakthrough bleeding with IUD 01/10/2018  . Encounter for annual routine gynecological examination 01/10/2018    Current Outpatient Medications on File Prior to Visit  Medication Sig Dispense Refill  . AJOVY 225 MG/1.5ML SOSY INJECT 225 MG INTO THE SKIN EVERY 30 (THIRTY) DAYS. APPOINTMENT NEEDED FOR FURTHER REFILLS. PLEASE CALL [phone] TO SCHEDULE WITH NP. 1.5 mL 11  . amphetamine-dextroamphetamine (ADDERALL) 20 MG tablet Take 40 mg by mouth daily.    Marland Kitchen AUVI-Q 0.3 MG/0.3ML SOAJ injection Use as directed for life-threatening allergic reaction. 2 each 3  . cetirizine (ZYRTEC) 10 MG tablet Take 10 mg by mouth as needed for allergies.    . diphenhydrAMINE HCl (BENADRYL ALLERGY PO) Take by mouth as needed.    . doxycycline (VIBRAMYCIN) 50 MG capsule Take one capsule once daily as directed 30 capsule 3  . escitalopram (LEXAPRO) 10 MG tablet Take 10 mg by mouth daily.    Marland Kitchen gabapentin (NEURONTIN) 300 MG capsule Take 1 capsule (300 mg total) by mouth at bedtime. 90 capsule 3  . hydrOXYzine (ATARAX/VISTARIL) 25 MG tablet TAKE 1 2 TABLETS BY MOUTH EVERY 4 6 HOURS AS NEEDED URTICARIA    . levonorgestrel (MIRENA) 20 MCG/24HR IUD 1 each by Intrauterine route once.    . meloxicam (MOBIC) 15 MG tablet TAKE 1 TABLET BY MOUTH EVERY DAY (Patient  taking differently: Take 15 mg by mouth daily as needed. ) 90 tablet 0  . metroNIDAZOLE (METROCREAM) 0.75 % cream Apply to face twice daily 45 g 5  . montelukast (SINGULAIR) 10 MG tablet TAKE ONE TABLET ONCE DAILY AS DIRECTED 90 tablet 1  . RESTASIS 0.05 % ophthalmic emulsion INSTILL 1 DROP INTO BOTH EYES TWICE A DAY    . SUMAtriptan (IMITREX) 50 MG tablet TAKE 1 TABLET BY MOUTH AS NEEDED FOR MIGRAINE. MAY REPEAT IN 2 HRS IF HEADACHE PERSISTS OR RECURS. MAX 2 TABS/24 HOURS. 9 tablet 2  . triamcinolone cream (KENALOG) 0.1 % APPLY 3 TIMES DAILY AS NEEDED TO RASH    . XOLAIR 150 MG injection INJECT 300 MG UNDER THE SKIN EVERY 4 WEEKS 1 each 11   Current Facility-Administered Medications on File Prior to Visit  Medication Dose Route Frequency Provider Last Rate Last Admin  . omalizumab Geoffry Paradise) injection 300 mg  300 mg Subcutaneous Q28 days Jessica Priest, MD   300 mg at 06/25/20 1618    Allergies  Allergen Reactions  . Aimovig [Erenumab-Aooe] Rash    Facial rash    Family History  Problem Relation Age of Onset  . COPD Mother   . Arthritis Mother   . Other Mother        gastric dumping syndrome   . Kidney disease Mother        stage 3  . High Cholesterol Mother   . Lung cancer Father   . Cancer  Sister        external female cancer   . Diabetes Sister   . Arthritis Sister   . Other Sister        gastric dumping syndrome   . High blood pressure Sister   . Migraines Sister     Social History   Socioeconomic History  . Marital status: Single    Spouse name: Not on file  . Number of children: 0  . Years of education: Not on file  . Highest education level: Bachelor's degree (e.g., BA, AB, BS)  Occupational History  . Not on file  Tobacco Use  . Smoking status: Never Smoker  . Smokeless tobacco: Never Used  Vaping Use  . Vaping Use: Never used  Substance and Sexual Activity  . Alcohol use: Yes    Alcohol/week: 14.0 standard drinks    Types: 14 Cans of beer per week     Comment: daily  . Drug use: Never  . Sexual activity: Not on file  Other Topics Concern  . Not on file  Social History Narrative   Lives at home alone    Right handed   Caffeine: 2 cups daily   Social Determinants of Health   Financial Resource Strain:   . Difficulty of Paying Living Expenses: Not on file  Food Insecurity:   . Worried About Programme researcher, broadcasting/film/video in the Last Year: Not on file  . Ran Out of Food in the Last Year: Not on file  Transportation Needs:   . Lack of Transportation (Medical): Not on file  . Lack of Transportation (Non-Medical): Not on file  Physical Activity:   . Days of Exercise per Week: Not on file  . Minutes of Exercise per Session: Not on file  Stress:   . Feeling of Stress : Not on file  Social Connections:   . Frequency of Communication with Friends and Family: Not on file  . Frequency of Social Gatherings with Friends and Family: Not on file  . Attends Religious Services: Not on file  . Active Member of Clubs or Organizations: Not on file  . Attends Banker Meetings: Not on file  . Marital Status: Not on file    Past Surgical History:  Procedure Laterality Date  . THYROIDECTOMY, PARTIAL  1997    Objective: Physical Exam  General: Well developed, nourished, no acute distress, awake, alert and oriented x 3  Vascular: Dorsalis pedis artery 2/4 bilateral, Posterior tibial artery 1/4 bilateral, skin temperature warm to warm proximal to distal bilateral lower extremities, no varicosities, pedal hair present bilateral.  Neurological: Gross sensation present via light touch bilateral.  Subjective tingling to now all toes on the right foot worse tos 1-2.  Dermatological: Skin is warm, dry, and supple bilateral, right hallux nail is short thick, and discolored with mild subungal debris previous culture Mircrotrauma, same raised soft tissue mass at medial hallux IPJ on right that measures 0.5x0.5cm nonpulsatile nonpainful likely  consistent with cyst unchanged in size from previous.  No signs of infection bilateral.  Musculoskeletal: Mild decreased and first metatarsophalangeal joint range of motion right greater than left there is 50 degrees of dorsiflexion and 10 degrees of plantarflexion at the right first metatarsophalangeal joint. Muscular strength within normal limits without pain on range of motion. No pain with calf compression bilateral.  Assessment and Plan:  Problem List Items Addressed This Visit    None    Visit Diagnoses    Soft tissue mass    -  Primary   Ganglion cyst       Neuritis       Right foot pain          -Examined patient -Re-Discussed with patient ultrasound results which is consistent with ganglion cyst or adventitial bursa.  -Patient opt for surgical management. Consent obtained for excision of soft tissue mass Pre and Post op course explained. Risks, benefits, alternatives explained. No guarantees given or implied. Surgical booking slip submitted and provided patient with Surgical packet and info for [facility]. -To dispense surgical shoe at center  -Office to arrange patient to speak with Chip Boer for discussion of cost of surgery -Recommend topical for neuritis and advised patient if this continues to worsen may benefit from follow-up evaluation with PCP for baseline blood work and further work-up for other causes of neuritis -Return to office after surgery or sooner if problems or issues arise  Asencion Islam, DPM

## 2020-07-23 ENCOUNTER — Other Ambulatory Visit: Payer: Self-pay

## 2020-07-23 ENCOUNTER — Ambulatory Visit (INDEPENDENT_AMBULATORY_CARE_PROVIDER_SITE_OTHER): Payer: Managed Care, Other (non HMO) | Admitting: *Deleted

## 2020-07-23 DIAGNOSIS — L501 Idiopathic urticaria: Secondary | ICD-10-CM | POA: Diagnosis not present

## 2020-08-08 ENCOUNTER — Telehealth: Payer: Self-pay

## 2020-08-08 NOTE — Telephone Encounter (Signed)
DOS 08/27/2020  EXC GANGLION TOE 1ST RT - 28092  [employer] EFFECTIVE DATE - 09/29/2018  PLAN DEDUCTIBLE - $500.00 W/$500.00 MET OUT OF POCKET - $2500.00 W/ $1916.60 MET COPAY $0.0 COINSURANCE - 90%  PER AUTOMATED SYSTEM NO PRECERT IS REQUIRED FOR CPT 28092. CONF # [id number]

## 2020-08-10 ENCOUNTER — Other Ambulatory Visit: Payer: Self-pay | Admitting: Allergy and Immunology

## 2020-08-20 ENCOUNTER — Ambulatory Visit: Payer: Managed Care, Other (non HMO)

## 2020-08-21 ENCOUNTER — Ambulatory Visit: Payer: Managed Care, Other (non HMO)

## 2020-08-22 ENCOUNTER — Other Ambulatory Visit: Payer: Self-pay

## 2020-08-22 ENCOUNTER — Ambulatory Visit (INDEPENDENT_AMBULATORY_CARE_PROVIDER_SITE_OTHER): Payer: Managed Care, Other (non HMO) | Admitting: *Deleted

## 2020-08-22 DIAGNOSIS — L501 Idiopathic urticaria: Secondary | ICD-10-CM

## 2020-08-24 ENCOUNTER — Other Ambulatory Visit: Payer: Self-pay | Admitting: Sports Medicine

## 2020-08-24 DIAGNOSIS — Z9889 Other specified postprocedural states: Secondary | ICD-10-CM

## 2020-08-24 NOTE — Progress Notes (Signed)
Post op meds entered 

## 2020-08-27 ENCOUNTER — Encounter: Payer: Self-pay | Admitting: Sports Medicine

## 2020-08-27 DIAGNOSIS — M67471 Ganglion, right ankle and foot: Secondary | ICD-10-CM

## 2020-08-27 MED ORDER — HYDROCODONE-ACETAMINOPHEN 10-325 MG PO TABS
1.0000 | ORAL_TABLET | Freq: Four times a day (QID) | ORAL | 0 refills | Status: AC | PRN
Start: 2020-08-27 — End: 2020-09-03

## 2020-08-27 MED ORDER — IBUPROFEN 800 MG PO TABS: 800.0000 mg | ORAL_TABLET | Freq: Three times a day (TID) | ORAL | 0 refills | Status: DC | PRN

## 2020-08-27 MED ORDER — PROMETHAZINE HCL 12.5 MG PO TABS: 12.5000 mg | ORAL_TABLET | Freq: Three times a day (TID) | ORAL | 0 refills | Status: DC | PRN

## 2020-08-27 MED ORDER — DOCUSATE SODIUM 100 MG PO CAPS: 100.0000 mg | ORAL_CAPSULE | Freq: Two times a day (BID) | ORAL | 0 refills | Status: DC

## 2020-08-30 ENCOUNTER — Telehealth: Payer: Self-pay | Admitting: *Deleted

## 2020-08-30 NOTE — Telephone Encounter (Signed)
Called and spoke with the patient and relayed the message per Dr Stover. Para Cossey °

## 2020-08-30 NOTE — Telephone Encounter (Signed)
-----   Message from Asencion Islam, [place] sent at 08/30/2020  9:39 AM EST ----- She can try to elevate as high as she can as long as it is comfortable She may return to work virtual or at home only ----- Message ----- From: Lanney Gins, [employer] Sent: 08/30/2020   8:35 AM EST To: Asencion Islam, DPM  Patient called and left a message asking a question about the elevating of the foot and can not do 6 inches like the instructions stated and would like to go back to work today and does work from home. Please Advise. Thanks Misty Stanley

## 2020-08-31 ENCOUNTER — Encounter: Payer: Self-pay | Admitting: Sports Medicine

## 2020-09-04 ENCOUNTER — Other Ambulatory Visit: Payer: Self-pay | Admitting: Allergy and Immunology

## 2020-09-04 ENCOUNTER — Other Ambulatory Visit: Payer: Self-pay

## 2020-09-04 ENCOUNTER — Encounter: Payer: Self-pay | Admitting: Sports Medicine

## 2020-09-04 ENCOUNTER — Ambulatory Visit (INDEPENDENT_AMBULATORY_CARE_PROVIDER_SITE_OTHER): Payer: Managed Care, Other (non HMO) | Admitting: Sports Medicine

## 2020-09-04 DIAGNOSIS — M6740 Ganglion, unspecified site: Secondary | ICD-10-CM

## 2020-09-04 DIAGNOSIS — Z9889 Other specified postprocedural states: Secondary | ICD-10-CM

## 2020-09-04 DIAGNOSIS — M792 Neuralgia and neuritis, unspecified: Secondary | ICD-10-CM

## 2020-09-04 DIAGNOSIS — M79671 Pain in right foot: Secondary | ICD-10-CM

## 2020-09-04 DIAGNOSIS — M7989 Other specified soft tissue disorders: Secondary | ICD-10-CM

## 2020-09-04 NOTE — Progress Notes (Signed)
Subjective: Christina Nixon is a 49 y.o. female patient seen today in office for POV # 1 (DOS 08-27-20), S/P excision of mass/cyst at right 1st toe. Patient denies pain at surgical site, doing ok and is ready for bandage to come off, denies calf pain, denies headache, chest pain, shortness of breath, nausea, vomiting, fever, or chills. Patient states that she is doing well and is only taking Ibuprofen last dose on Saturday. No other issues noted.   Patient Active Problem List   Diagnosis Date Noted  . Migraine without aura and with status migrainosus, not intractable 07/19/2018  . New daily persistent headache 07/05/2018  . Attention deficit disorder (ADD) without hyperactivity 03/05/2018  . Essential hypertension 03/05/2018  . Family history of MI (myocardial infarction) 03/05/2018  . Atypical chest pain 01/10/2018  . Breakthrough bleeding with IUD 01/10/2018  . Encounter for annual routine gynecological examination 01/10/2018    Current Outpatient Medications on File Prior to Visit  Medication Sig Dispense Refill  . AJOVY 225 MG/1.5ML SOSY INJECT 225 MG INTO THE SKIN EVERY 30 (THIRTY) DAYS. APPOINTMENT NEEDED FOR FURTHER REFILLS. PLEASE CALL [phone] TO SCHEDULE WITH NP. 1.5 mL 11  . amphetamine-dextroamphetamine (ADDERALL) 20 MG tablet Take 40 mg by mouth daily.    Marland Kitchen AUVI-Q 0.3 MG/0.3ML SOAJ injection Use as directed for life-threatening allergic reaction. 2 each 3  . cetirizine (ZYRTEC) 10 MG tablet Take 10 mg by mouth as needed for allergies.    . diphenhydrAMINE HCl (BENADRYL ALLERGY PO) Take by mouth as needed.    . docusate sodium (COLACE) 100 MG capsule Take 1 capsule (100 mg total) by mouth 2 (two) times daily. 10 capsule 0  . escitalopram (LEXAPRO) 10 MG tablet Take 10 mg by mouth daily.    Marland Kitchen gabapentin (NEURONTIN) 300 MG capsule Take 1 capsule (300 mg total) by mouth at bedtime. 90 capsule 3  . hydrOXYzine (ATARAX/VISTARIL) 25 MG tablet TAKE 1 2 TABLETS BY MOUTH EVERY 4 6  HOURS AS NEEDED URTICARIA    . ibuprofen (ADVIL) 800 MG tablet Take 1 tablet (800 mg total) by mouth every 8 (eight) hours as needed. 30 tablet 0  . levonorgestrel (MIRENA) 20 MCG/24HR IUD 1 each by Intrauterine route once.    . meloxicam (MOBIC) 15 MG tablet TAKE 1 TABLET BY MOUTH EVERY DAY (Patient taking differently: Take 15 mg by mouth daily as needed. ) 90 tablet 0  . metroNIDAZOLE (METROCREAM) 0.75 % cream Apply to face twice daily 45 g 5  . montelukast (SINGULAIR) 10 MG tablet TAKE ONE TABLET ONCE DAILY AS DIRECTED 90 tablet 1  . promethazine (PHENERGAN) 12.5 MG tablet Take 1 tablet (12.5 mg total) by mouth every 8 (eight) hours as needed for nausea or vomiting. 20 tablet 0  . RESTASIS 0.05 % ophthalmic emulsion INSTILL 1 DROP INTO BOTH EYES TWICE A DAY    . SUMAtriptan (IMITREX) 50 MG tablet TAKE 1 TABLET BY MOUTH AS NEEDED FOR MIGRAINE. MAY REPEAT IN 2 HRS IF HEADACHE PERSISTS OR RECURS. MAX 2 TABS/24 HOURS. 9 tablet 2  . triamcinolone cream (KENALOG) 0.1 % APPLY 3 TIMES DAILY AS NEEDED TO RASH    . XOLAIR 150 MG injection INJECT 300 MG UNDER THE SKIN EVERY 4 WEEKS 1 each 11   Current Facility-Administered Medications on File Prior to Visit  Medication Dose Route Frequency Provider Last Rate Last Admin  . omalizumab Geoffry Paradise) injection 300 mg  300 mg Subcutaneous Q28 days Kozlow, Alvira Philips, MD   300  mg at 08/22/20 1620    Allergies  Allergen Reactions  . Aimovig [Erenumab-Aooe] Rash    Facial rash    Objective: There were no vitals filed for this visit.  General: No acute distress, AAOx3  Right foot: Sutures intact with no gapping or dehiscence at surgical site, mild swelling to1st toe, no erythema, no warmth, no drainage, no signs of infection noted, Capillary fill time <3 seconds in all digits, gross sensation present via light touch to right foot. No tingling to toes. No pain or crepitation with range of motion right/left foot.  No pain with calf compression.    Assessment and  Plan:  Problem List Items Addressed This Visit    None    Visit Diagnoses    S/P foot surgery, right    -  Primary   Soft tissue mass       Ganglion cyst       Neuritis       Right foot pain           -Patient seen and evaluated -Pathology reviewed consistent with cyst -Applied dry sterile dressing to surgical site right foot secured with ACE wrap and stockinet  -Advised patient to make sure to keep dressings clean, dry, and intact to right surgical site, removing the ACE as needed  -Advised patient to continue with post-op shoe on right foot and added additional padding to post op shoe -Advised patient to limit activity to necessity and NO DRIVING  -Advised patient to ice and elevate as necessary and may continue with motrin as needed for pain and inflammation -Will plan for possible suture removal at next office visit. In the meantime, patient to call office if any issues or problems arise.   Asencion Islam, DPM

## 2020-09-11 ENCOUNTER — Ambulatory Visit (INDEPENDENT_AMBULATORY_CARE_PROVIDER_SITE_OTHER): Payer: Managed Care, Other (non HMO) | Admitting: Sports Medicine

## 2020-09-11 ENCOUNTER — Other Ambulatory Visit: Payer: Self-pay

## 2020-09-11 ENCOUNTER — Encounter: Payer: Self-pay | Admitting: Sports Medicine

## 2020-09-11 DIAGNOSIS — M792 Neuralgia and neuritis, unspecified: Secondary | ICD-10-CM

## 2020-09-11 DIAGNOSIS — Z9889 Other specified postprocedural states: Secondary | ICD-10-CM

## 2020-09-11 DIAGNOSIS — M6740 Ganglion, unspecified site: Secondary | ICD-10-CM

## 2020-09-11 DIAGNOSIS — M7989 Other specified soft tissue disorders: Secondary | ICD-10-CM

## 2020-09-11 DIAGNOSIS — M79671 Pain in right foot: Secondary | ICD-10-CM

## 2020-09-11 NOTE — Progress Notes (Signed)
Subjective: Christina Nixon is a 49 y.o. female patient seen today in office for POV # 2 (DOS 08-27-20), S/P excision of mass/cyst at right 1st toe. Patient denies pain at surgical site, doing ok and no pain in the right foot currently. No other issues noted.   Patient Active Problem List   Diagnosis Date Noted  . Migraine without aura and with status migrainosus, not intractable 07/19/2018  . New daily persistent headache 07/05/2018  . Attention deficit disorder (ADD) without hyperactivity 03/05/2018  . Essential hypertension 03/05/2018  . Family history of MI (myocardial infarction) 03/05/2018  . Atypical chest pain 01/10/2018  . Breakthrough bleeding with IUD 01/10/2018  . Encounter for annual routine gynecological examination 01/10/2018    Current Outpatient Medications on File Prior to Visit  Medication Sig Dispense Refill  . AJOVY 225 MG/1.5ML SOSY INJECT 225 MG INTO THE SKIN EVERY 30 (THIRTY) DAYS. APPOINTMENT NEEDED FOR FURTHER REFILLS. PLEASE CALL [phone] TO SCHEDULE WITH NP. 1.5 mL 11  . amphetamine-dextroamphetamine (ADDERALL) 20 MG tablet Take 40 mg by mouth daily.    Marland Kitchen AUVI-Q 0.3 MG/0.3ML SOAJ injection Use as directed for life-threatening allergic reaction. 2 each 3  . cetirizine (ZYRTEC) 10 MG tablet Take 10 mg by mouth as needed for allergies.    . diphenhydrAMINE HCl (BENADRYL ALLERGY PO) Take by mouth as needed.    . docusate sodium (COLACE) 100 MG capsule Take 1 capsule (100 mg total) by mouth 2 (two) times daily. 10 capsule 0  . doxycycline (VIBRAMYCIN) 50 MG capsule TAKE ONE CAPSULE ONCE DAILY AS DIRECTED 30 capsule 3  . escitalopram (LEXAPRO) 10 MG tablet Take 10 mg by mouth daily.    Marland Kitchen gabapentin (NEURONTIN) 300 MG capsule Take 1 capsule (300 mg total) by mouth at bedtime. 90 capsule 3  . hydrOXYzine (ATARAX/VISTARIL) 25 MG tablet TAKE 1 2 TABLETS BY MOUTH EVERY 4 6 HOURS AS NEEDED URTICARIA    . ibuprofen (ADVIL) 800 MG tablet Take 1 tablet (800 mg total) by  mouth every 8 (eight) hours as needed. 30 tablet 0  . levonorgestrel (MIRENA) 20 MCG/24HR IUD 1 each by Intrauterine route once.    . meloxicam (MOBIC) 15 MG tablet TAKE 1 TABLET BY MOUTH EVERY DAY (Patient taking differently: Take 15 mg by mouth daily as needed. ) 90 tablet 0  . metroNIDAZOLE (METROCREAM) 0.75 % cream Apply to face twice daily 45 g 5  . montelukast (SINGULAIR) 10 MG tablet TAKE ONE TABLET ONCE DAILY AS DIRECTED 90 tablet 1  . promethazine (PHENERGAN) 12.5 MG tablet Take 1 tablet (12.5 mg total) by mouth every 8 (eight) hours as needed for nausea or vomiting. 20 tablet 0  . RESTASIS 0.05 % ophthalmic emulsion INSTILL 1 DROP INTO BOTH EYES TWICE A DAY    . SUMAtriptan (IMITREX) 50 MG tablet TAKE 1 TABLET BY MOUTH AS NEEDED FOR MIGRAINE. MAY REPEAT IN 2 HRS IF HEADACHE PERSISTS OR RECURS. MAX 2 TABS/24 HOURS. 9 tablet 2  . triamcinolone cream (KENALOG) 0.1 % APPLY 3 TIMES DAILY AS NEEDED TO RASH    . XOLAIR 150 MG injection INJECT 300 MG UNDER THE SKIN EVERY 4 WEEKS 1 each 11   Current Facility-Administered Medications on File Prior to Visit  Medication Dose Route Frequency Provider Last Rate Last Admin  . omalizumab Geoffry Paradise) injection 300 mg  300 mg Subcutaneous Q28 days Jessica Priest, MD   300 mg at 08/22/20 1620    Allergies  Allergen Reactions  . Aimovig [  Erenumab-Aooe] Rash    Facial rash    Objective: There were no vitals filed for this visit.  General: No acute distress, AAOx3  Right foot: Sutures intact with dry crusting but no gapping or dehiscence at surgical site, mild swelling to1st toe, no erythema, no warmth, no drainage, no signs of infection noted, Capillary fill time <3 seconds in all digits, gross sensation present via light touch to right foot. No tingling to toes. No pain or crepitation with range of motion right/left foot.  No pain with calf compression.    Assessment and Plan:  Problem List Items Addressed This Visit   None   Visit Diagnoses     S/P foot surgery, right    -  Primary   Soft tissue mass       Ganglion cyst       Neuritis       Right foot pain           -Patient seen and evaluated -A few sutures were removed -Applied dry sterile dressing to surgical site right foot secured with ACE wrap and stockinet  -Advised patient to make sure to keep dressings clean, dry, and intact to right surgical site until next week.  May redress as instructed using antibiotic cream and dry dressing on next Tuesday. -Advised patient to continue with post-op shoe on right foot -Advised patient to continue with rest ice elevation and to limit excessive motion to toe -Will plan for finishing suture removal at next office visit. In the meantime, patient to call office if any issues or problems arise.   Asencion Islam, DPM

## 2020-09-19 ENCOUNTER — Ambulatory Visit (INDEPENDENT_AMBULATORY_CARE_PROVIDER_SITE_OTHER): Payer: Managed Care, Other (non HMO) | Admitting: *Deleted

## 2020-09-19 ENCOUNTER — Encounter: Payer: Self-pay | Admitting: Sports Medicine

## 2020-09-19 ENCOUNTER — Ambulatory Visit (INDEPENDENT_AMBULATORY_CARE_PROVIDER_SITE_OTHER): Payer: Managed Care, Other (non HMO) | Admitting: Sports Medicine

## 2020-09-19 ENCOUNTER — Other Ambulatory Visit: Payer: Self-pay

## 2020-09-19 DIAGNOSIS — M79671 Pain in right foot: Secondary | ICD-10-CM

## 2020-09-19 DIAGNOSIS — L501 Idiopathic urticaria: Secondary | ICD-10-CM | POA: Diagnosis not present

## 2020-09-19 DIAGNOSIS — M7989 Other specified soft tissue disorders: Secondary | ICD-10-CM

## 2020-09-19 DIAGNOSIS — M6740 Ganglion, unspecified site: Secondary | ICD-10-CM

## 2020-09-19 DIAGNOSIS — M792 Neuralgia and neuritis, unspecified: Secondary | ICD-10-CM

## 2020-09-19 DIAGNOSIS — Z9889 Other specified postprocedural states: Secondary | ICD-10-CM

## 2020-09-19 NOTE — Progress Notes (Signed)
Subjective: Bryli Mantey is a 49 y.o. female patient seen today in office for POV # 3 (DOS 08-27-20), S/P excision of mass/cyst at right 1st toe. Patient denies pain at surgical site, doing good and change dressing as instructed on last week. No other issues noted.   Patient Active Problem List   Diagnosis Date Noted  . Migraine without aura and with status migrainosus, not intractable 07/19/2018  . New daily persistent headache 07/05/2018  . Attention deficit disorder (ADD) without hyperactivity 03/05/2018  . Essential hypertension 03/05/2018  . Family history of MI (myocardial infarction) 03/05/2018  . Atypical chest pain 01/10/2018  . Breakthrough bleeding with IUD 01/10/2018  . Encounter for annual routine gynecological examination 01/10/2018    Current Outpatient Medications on File Prior to Visit  Medication Sig Dispense Refill  . AJOVY 225 MG/1.5ML SOSY INJECT 225 MG INTO THE SKIN EVERY 30 (THIRTY) DAYS. APPOINTMENT NEEDED FOR FURTHER REFILLS. PLEASE CALL [phone] TO SCHEDULE WITH NP. 1.5 mL 11  . amphetamine-dextroamphetamine (ADDERALL) 20 MG tablet Take 40 mg by mouth daily.    Marland Kitchen AUVI-Q 0.3 MG/0.3ML SOAJ injection Use as directed for life-threatening allergic reaction. 2 each 3  . cetirizine (ZYRTEC) 10 MG tablet Take 10 mg by mouth as needed for allergies.    . diphenhydrAMINE HCl (BENADRYL ALLERGY PO) Take by mouth as needed.    . docusate sodium (COLACE) 100 MG capsule Take 1 capsule (100 mg total) by mouth 2 (two) times daily. 10 capsule 0  . doxycycline (VIBRAMYCIN) 50 MG capsule TAKE ONE CAPSULE ONCE DAILY AS DIRECTED 30 capsule 3  . escitalopram (LEXAPRO) 10 MG tablet Take 10 mg by mouth daily.    Marland Kitchen gabapentin (NEURONTIN) 300 MG capsule Take 1 capsule (300 mg total) by mouth at bedtime. 90 capsule 3  . hydrOXYzine (ATARAX/VISTARIL) 25 MG tablet TAKE 1 2 TABLETS BY MOUTH EVERY 4 6 HOURS AS NEEDED URTICARIA    . ibuprofen (ADVIL) 800 MG tablet Take 1 tablet (800 mg  total) by mouth every 8 (eight) hours as needed. 30 tablet 0  . levonorgestrel (MIRENA) 20 MCG/24HR IUD 1 each by Intrauterine route once.    . meloxicam (MOBIC) 15 MG tablet TAKE 1 TABLET BY MOUTH EVERY DAY (Patient taking differently: Take 15 mg by mouth daily as needed. ) 90 tablet 0  . metroNIDAZOLE (METROCREAM) 0.75 % cream Apply to face twice daily 45 g 5  . montelukast (SINGULAIR) 10 MG tablet TAKE ONE TABLET ONCE DAILY AS DIRECTED 90 tablet 1  . promethazine (PHENERGAN) 12.5 MG tablet Take 1 tablet (12.5 mg total) by mouth every 8 (eight) hours as needed for nausea or vomiting. 20 tablet 0  . RESTASIS 0.05 % ophthalmic emulsion INSTILL 1 DROP INTO BOTH EYES TWICE A DAY    . SUMAtriptan (IMITREX) 50 MG tablet TAKE 1 TABLET BY MOUTH AS NEEDED FOR MIGRAINE. MAY REPEAT IN 2 HRS IF HEADACHE PERSISTS OR RECURS. MAX 2 TABS/24 HOURS. 9 tablet 2  . triamcinolone cream (KENALOG) 0.1 % APPLY 3 TIMES DAILY AS NEEDED TO RASH    . XOLAIR 150 MG injection INJECT 300 MG UNDER THE SKIN EVERY 4 WEEKS 1 each 11   Current Facility-Administered Medications on File Prior to Visit  Medication Dose Route Frequency Provider Last Rate Last Admin  . omalizumab Geoffry Paradise) injection 300 mg  300 mg Subcutaneous Q28 days Jessica Priest, MD   300 mg at 08/22/20 1620    Allergies  Allergen Reactions  . Aimovig [  Erenumab-Aooe] Rash    Facial rash    Objective: There were no vitals filed for this visit.  General: No acute distress, AAOx3  Right foot: Sutures intact with dry crusting/scab at surgical site, mild swelling to1st toe, resolved erythema, no warmth, no drainage, no signs of infection noted, Capillary fill time <3 seconds in all digits, gross sensation present via light touch to right foot.No pain or crepitation with range of motion right foot.  No pain with calf compression.    Assessment and Plan:  Problem List Items Addressed This Visit   None   Visit Diagnoses    S/P foot surgery, right    -   Primary   Soft tissue mass       Ganglion cyst       Neuritis       Right foot pain           -Patient seen and evaluated -Remaining sutures were removed -Applied dry sterile dressing to surgical site right foot secured with Coban wrap and stockinet -Advised patient may shower on next week and may slowly transition to a normal shoe as long as there is no rubbing but to continue to keep dressed with a dry gauze and Coban dressing until next office visit -Advised patient to continue with rest ice elevation as needed for swelling or pain -Will plan for surgical wound check at next office visit. In the meantime, patient to call office if any issues or problems arise.  Advised patient that after next visit we can discuss her resuming her Toecyln topical to the great toenail on the right. Asencion Islam, DPM

## 2020-10-02 ENCOUNTER — Encounter: Payer: Self-pay | Admitting: Sports Medicine

## 2020-10-02 ENCOUNTER — Ambulatory Visit (INDEPENDENT_AMBULATORY_CARE_PROVIDER_SITE_OTHER): Payer: Managed Care, Other (non HMO) | Admitting: Sports Medicine

## 2020-10-02 ENCOUNTER — Other Ambulatory Visit: Payer: Self-pay

## 2020-10-02 ENCOUNTER — Encounter: Payer: Managed Care, Other (non HMO) | Admitting: Sports Medicine

## 2020-10-02 DIAGNOSIS — M79671 Pain in right foot: Secondary | ICD-10-CM

## 2020-10-02 DIAGNOSIS — M7989 Other specified soft tissue disorders: Secondary | ICD-10-CM

## 2020-10-02 DIAGNOSIS — M792 Neuralgia and neuritis, unspecified: Secondary | ICD-10-CM

## 2020-10-02 DIAGNOSIS — M6740 Ganglion, unspecified site: Secondary | ICD-10-CM

## 2020-10-02 DIAGNOSIS — Z9889 Other specified postprocedural states: Secondary | ICD-10-CM

## 2020-10-02 NOTE — Progress Notes (Signed)
Subjective: Christina Nixon is a 50 y.o. female patient seen today in office for POV # 4 (DOS 08-27-20), S/P excision of mass/cyst at right 1st toe. Patient denies pain at surgical site and states that she has been keeping it covered and using a normal shoe. No other issues noted.   Patient Active Problem List   Diagnosis Date Noted  . Migraine without aura and with status migrainosus, not intractable 07/19/2018  . New daily persistent headache 07/05/2018  . Attention deficit disorder (ADD) without hyperactivity 03/05/2018  . Essential hypertension 03/05/2018  . Family history of MI (myocardial infarction) 03/05/2018  . Atypical chest pain 01/10/2018  . Breakthrough bleeding with IUD 01/10/2018  . Encounter for annual routine gynecological examination 01/10/2018    Current Outpatient Medications on File Prior to Visit  Medication Sig Dispense Refill  . AJOVY 225 MG/1.5ML SOSY INJECT 225 MG INTO THE SKIN EVERY 30 (THIRTY) DAYS. APPOINTMENT NEEDED FOR FURTHER REFILLS. PLEASE CALL [phone] TO SCHEDULE WITH NP. 1.5 mL 11  . amphetamine-dextroamphetamine (ADDERALL) 20 MG tablet Take 40 mg by mouth daily.    Marland Kitchen AUVI-Q 0.3 MG/0.3ML SOAJ injection Use as directed for life-threatening allergic reaction. 2 each 3  . cetirizine (ZYRTEC) 10 MG tablet Take 10 mg by mouth as needed for allergies.    . diphenhydrAMINE HCl (BENADRYL ALLERGY PO) Take by mouth as needed.    . docusate sodium (COLACE) 100 MG capsule Take 1 capsule (100 mg total) by mouth 2 (two) times daily. 10 capsule 0  . doxycycline (VIBRAMYCIN) 50 MG capsule TAKE ONE CAPSULE ONCE DAILY AS DIRECTED 30 capsule 3  . escitalopram (LEXAPRO) 10 MG tablet Take 10 mg by mouth daily.    Marland Kitchen gabapentin (NEURONTIN) 300 MG capsule Take 1 capsule (300 mg total) by mouth at bedtime. 90 capsule 3  . hydrOXYzine (ATARAX/VISTARIL) 25 MG tablet TAKE 1 2 TABLETS BY MOUTH EVERY 4 6 HOURS AS NEEDED URTICARIA    . ibuprofen (ADVIL) 800 MG tablet Take 1  tablet (800 mg total) by mouth every 8 (eight) hours as needed. 30 tablet 0  . levonorgestrel (MIRENA) 20 MCG/24HR IUD 1 each by Intrauterine route once.    . meloxicam (MOBIC) 15 MG tablet TAKE 1 TABLET BY MOUTH EVERY DAY (Patient taking differently: Take 15 mg by mouth daily as needed. ) 90 tablet 0  . metroNIDAZOLE (METROCREAM) 0.75 % cream Apply to face twice daily 45 g 5  . montelukast (SINGULAIR) 10 MG tablet TAKE ONE TABLET ONCE DAILY AS DIRECTED 90 tablet 1  . promethazine (PHENERGAN) 12.5 MG tablet Take 1 tablet (12.5 mg total) by mouth every 8 (eight) hours as needed for nausea or vomiting. 20 tablet 0  . RESTASIS 0.05 % ophthalmic emulsion INSTILL 1 DROP INTO BOTH EYES TWICE A DAY    . SUMAtriptan (IMITREX) 50 MG tablet TAKE 1 TABLET BY MOUTH AS NEEDED FOR MIGRAINE. MAY REPEAT IN 2 HRS IF HEADACHE PERSISTS OR RECURS. MAX 2 TABS/24 HOURS. 9 tablet 2  . triamcinolone cream (KENALOG) 0.1 % APPLY 3 TIMES DAILY AS NEEDED TO RASH    . XOLAIR 150 MG injection INJECT 300 MG UNDER THE SKIN EVERY 4 WEEKS 1 each 11   Current Facility-Administered Medications on File Prior to Visit  Medication Dose Route Frequency Provider Last Rate Last Admin  . omalizumab Geoffry Paradise) injection 300 mg  300 mg Subcutaneous Q28 days Jessica Priest, MD   300 mg at 09/19/20 1115    Allergies  Allergen  Reactions  . Aimovig [Erenumab-Aooe] Rash    Facial rash    Objective: There were no vitals filed for this visit.  General: No acute distress, AAOx3  Right foot: Dry scab at surgical site, minimal swelling to1st toe, no erythema, no warmth, no drainage, no signs of infection noted, Capillary fill time <3 seconds in all digits, gross sensation present via light touch to right foot.No pain or crepitation with range of motion right foot.  No pain with calf compression.    Assessment and Plan:  Problem List Items Addressed This Visit   None   Visit Diagnoses    S/P foot surgery, right    -  Primary   Soft tissue  mass       Ganglion cyst       Neuritis       Right foot pain           -Patient seen and evaluated -May use bandaid at right 1st toe when in shoe -Continue with good supportive shoes -Will plan for final surgical wound check at next visit.  Asencion Islam, DPM

## 2020-10-17 ENCOUNTER — Other Ambulatory Visit: Payer: Self-pay

## 2020-10-17 ENCOUNTER — Ambulatory Visit (INDEPENDENT_AMBULATORY_CARE_PROVIDER_SITE_OTHER): Payer: Managed Care, Other (non HMO)

## 2020-10-17 DIAGNOSIS — L501 Idiopathic urticaria: Secondary | ICD-10-CM | POA: Diagnosis not present

## 2020-11-14 ENCOUNTER — Ambulatory Visit: Payer: Self-pay

## 2020-11-15 ENCOUNTER — Other Ambulatory Visit: Payer: Self-pay

## 2020-11-15 ENCOUNTER — Ambulatory Visit (INDEPENDENT_AMBULATORY_CARE_PROVIDER_SITE_OTHER): Payer: Managed Care, Other (non HMO) | Admitting: *Deleted

## 2020-11-15 DIAGNOSIS — L501 Idiopathic urticaria: Secondary | ICD-10-CM

## 2020-11-26 ENCOUNTER — Telehealth: Payer: Self-pay | Admitting: Sports Medicine

## 2020-11-26 NOTE — Telephone Encounter (Signed)
Patient cancelled POV visit and that she would call back to R/S

## 2020-11-26 NOTE — Telephone Encounter (Signed)
Ok thanks 

## 2020-11-28 ENCOUNTER — Encounter: Payer: Managed Care, Other (non HMO) | Admitting: Sports Medicine

## 2020-12-13 ENCOUNTER — Ambulatory Visit (INDEPENDENT_AMBULATORY_CARE_PROVIDER_SITE_OTHER): Payer: Managed Care, Other (non HMO) | Admitting: *Deleted

## 2020-12-13 ENCOUNTER — Other Ambulatory Visit: Payer: Self-pay

## 2020-12-13 DIAGNOSIS — L501 Idiopathic urticaria: Secondary | ICD-10-CM | POA: Diagnosis not present

## 2020-12-18 ENCOUNTER — Other Ambulatory Visit: Payer: Self-pay | Admitting: Neurology

## 2020-12-19 ENCOUNTER — Telehealth (INDEPENDENT_AMBULATORY_CARE_PROVIDER_SITE_OTHER): Payer: Managed Care, Other (non HMO) | Admitting: Family Medicine

## 2020-12-19 ENCOUNTER — Encounter: Payer: Self-pay | Admitting: Family Medicine

## 2020-12-19 ENCOUNTER — Other Ambulatory Visit: Payer: Self-pay | Admitting: Family Medicine

## 2020-12-19 ENCOUNTER — Telehealth: Payer: Self-pay | Admitting: Nurse Practitioner

## 2020-12-19 ENCOUNTER — Other Ambulatory Visit: Payer: Self-pay | Admitting: Neurology

## 2020-12-19 DIAGNOSIS — G43009 Migraine without aura, not intractable, without status migrainosus: Secondary | ICD-10-CM

## 2020-12-19 MED ORDER — AJOVY 225 MG/1.5ML ~~LOC~~ SOSY: 225.0000 mg | PREFILLED_SYRINGE | SUBCUTANEOUS | 11 refills | Status: DC

## 2020-12-19 MED ORDER — SUMATRIPTAN SUCCINATE 50 MG PO TABS: ORAL_TABLET | ORAL | 2 refills | Status: DC

## 2020-12-19 NOTE — Telephone Encounter (Signed)
Pt would like to know if she can take the available office slot for Amy,NP @ 10:30 this morning, pt would like to make it a my chart vv.  Pt has no new issues.  Please call

## 2020-12-19 NOTE — Telephone Encounter (Signed)
Spoke with pt, she is doing great, needs refills.  Made appt for her today VV at 1030.

## 2020-12-19 NOTE — Progress Notes (Addendum)
PATIENT: Christina Nixon DOB: [birth year]  REASON FOR VISIT: follow up HISTORY FROM: patient  Virtual Visit via Telephone Note  I connected with Christina Nixon on 12/19/20 at 10:30 AM EDT by telephone and verified that I am speaking with the correct person using two identifiers.   I discussed the limitations, risks, security and privacy concerns of performing an evaluation and management service by telephone and the availability of in person appointments. I also discussed with the patient that there may be a patient responsible charge related to this service. The patient expressed understanding and agreed to proceed.   History of Present Illness:  12/19/20 ALL: Christina Nixon is a 50 y.o. female here today for follow up for migraines. She continues Ajovy and sumatriptan. She is doing very well. Rarely has a headache. Has not used sumatriptan in over a year. She is followed closely by PCP.   08/05/2018 CM:  Christina Nixon, 50 year old female returns for follow-up with migraine headaches.  She has failed nerve blocks.  She has failed propanolol. She is currently on Topamax dose was increased recently to 100 mg at night.  She was started on Effexor yesterday for her significant anxiety and stress however she has not picked up the prescription.  She is here today to learn how to inject herself with Aimovig as a preventive.  Patient made aware this is not an instant fix  and she may result see results in 3 to 6 months.  In addition with her Effexor she is advised to see a counselor and a psychiatrist as her additional stress and anxiety are comorbidities for effective treatment of her migraines.  She returns for reevaluation  07/15/18 AA:  Christina Nixon [facility] a 50 y.o.femalehere as requested by Dr. Dutch Quint migraine. She does not have a history of significant headaches. Started on Wednesday October 2nd in the frontal area, pounding,throbbing, continuous, no light sensitivity no sound  sensitivity, +nausea, ear ringing. She thought it was sinusitis and started on pseudephedrine and nasal spray. Tylenol didn't help. She had scalp sensitivity. On October 5th she went to a festival and took meloxicam, it was hard for her to sleep and she took a sleep aid to see if it would help and it did not. She tried goody powder on the 8th. She has tried multiple things, she went to [facility], worse with movement, better with sitting still. On the 7th she feels it went to a 7/10, pounding, checked blood pressure, Toradol did not help. She is very active, has tried multiple other options including BC powders, sumatriptan took it on 10/9 did not help and took a second dose. It became a 10/10, it made her cry, severe pain. On October 10th she felt well but it came back in the afternoon. She stayed home on the 11th took 2 alleve no help. A massage did not help. She saw her pcp on the 11th again she took 100mg  sumatriptan, 2 alleve then took 2 more sumatriptan (50mg  pill). Saturday the 12th she felt a little better. She tried multiple other things. Her sister has migraines. She went to the ER on Saturday. On the 13th she had treatment in the ED and she felt better but came back the next day. She started Topiramate 14th. The headache came back the 15th, Has been an 8. Yesterday was a 2-3 in the morning and increased throughout the day to an 8/9. Right now    Observations/Objective:  Generalized: Well developed, in  no acute distress  Mentation: Alert oriented to time, place, history taking. Follows all commands speech and language fluent   Assessment and Plan:  50 y.o. year old female  has a past medical history of Migraine and Urticaria. here with    ICD-10-CM   1. Migraine without aura and without status migrainosus, not intractable  G43.009      She is doing well on Ajovy. She has not needed sumatriptan but has available for abortive therapy. I will update prescriptions, today. She  was encouraged to continue healthy lifestyle habits and follow up closely with PCP. She will return for follow up with me in 1 year.   No orders of the defined types were placed in this encounter.   Meds ordered this encounter  Medications  . SUMAtriptan (IMITREX) 50 MG tablet    Sig: TAKE 1 TABLET BY MOUTH AS NEEDED FOR MIGRAINE. MAY REPEAT IN 2 HRS IF HEADACHE PERSISTS OR RECURS. MAX 2 TABS/24 HOURS.    Dispense:  9 tablet    Refill:  2    Needs FU by Oct, please have her call    Order Specific Question:   Supervising Provider    Answer:   Anson Fret [[id number]]  . Fremanezumab-vfrm (AJOVY) 225 MG/1.5ML SOSY    Sig: Inject 225 mg into the skin every 30 (thirty) days. Appointment needed for further refills. Please call [phone] to schedule with NP.    Dispense:  1.5 mL    Refill:  11    Order Specific Question:   Supervising Provider    Answer:   Anson Fret [[id number]]     Follow Up Instructions:  I discussed the assessment and treatment plan with the patient. The patient was provided an opportunity to ask questions and all were answered. The patient agreed with the plan and demonstrated an understanding of the instructions.   The patient was advised to call back or seek an in-person evaluation if the symptoms worsen or if the condition fails to improve as anticipated.  I provided 15 minutes of non-face-to-face time during this encounter. Patient located at their place of residence during Mychart visit. Provider is in the office.    Shawnie Dapper, NP   Made any corrections needed, and agree with history, physical, neuro exam,assessment and plan as stated.     Naomie Dean, MD [facility]

## 2021-01-02 ENCOUNTER — Other Ambulatory Visit: Payer: Self-pay | Admitting: Allergy and Immunology

## 2021-01-10 ENCOUNTER — Ambulatory Visit (INDEPENDENT_AMBULATORY_CARE_PROVIDER_SITE_OTHER): Payer: Managed Care, Other (non HMO) | Admitting: *Deleted

## 2021-01-10 ENCOUNTER — Other Ambulatory Visit: Payer: Self-pay

## 2021-01-10 DIAGNOSIS — L501 Idiopathic urticaria: Secondary | ICD-10-CM | POA: Diagnosis not present

## 2021-02-07 ENCOUNTER — Ambulatory Visit (INDEPENDENT_AMBULATORY_CARE_PROVIDER_SITE_OTHER): Payer: Managed Care, Other (non HMO) | Admitting: *Deleted

## 2021-02-07 ENCOUNTER — Other Ambulatory Visit: Payer: Self-pay

## 2021-02-07 DIAGNOSIS — L501 Idiopathic urticaria: Secondary | ICD-10-CM | POA: Diagnosis not present

## 2021-02-12 ENCOUNTER — Other Ambulatory Visit: Payer: Self-pay | Admitting: Allergy and Immunology

## 2021-03-06 ENCOUNTER — Other Ambulatory Visit: Payer: Self-pay

## 2021-03-06 ENCOUNTER — Ambulatory Visit (INDEPENDENT_AMBULATORY_CARE_PROVIDER_SITE_OTHER): Payer: Managed Care, Other (non HMO) | Admitting: *Deleted

## 2021-03-06 DIAGNOSIS — L501 Idiopathic urticaria: Secondary | ICD-10-CM | POA: Diagnosis not present

## 2021-03-07 ENCOUNTER — Ambulatory Visit: Payer: Managed Care, Other (non HMO)

## 2021-04-03 ENCOUNTER — Ambulatory Visit: Payer: Managed Care, Other (non HMO)

## 2021-04-04 ENCOUNTER — Ambulatory Visit (INDEPENDENT_AMBULATORY_CARE_PROVIDER_SITE_OTHER): Payer: Managed Care, Other (non HMO) | Admitting: *Deleted

## 2021-04-04 ENCOUNTER — Other Ambulatory Visit: Payer: Self-pay

## 2021-04-04 DIAGNOSIS — L501 Idiopathic urticaria: Secondary | ICD-10-CM | POA: Diagnosis not present

## 2021-05-02 ENCOUNTER — Ambulatory Visit: Payer: Managed Care, Other (non HMO)

## 2021-05-06 ENCOUNTER — Ambulatory Visit: Payer: Managed Care, Other (non HMO)

## 2021-05-07 ENCOUNTER — Ambulatory Visit: Payer: Managed Care, Other (non HMO)

## 2021-05-23 ENCOUNTER — Other Ambulatory Visit: Payer: Self-pay

## 2021-05-23 ENCOUNTER — Ambulatory Visit (INDEPENDENT_AMBULATORY_CARE_PROVIDER_SITE_OTHER): Payer: Managed Care, Other (non HMO)

## 2021-05-23 DIAGNOSIS — L501 Idiopathic urticaria: Secondary | ICD-10-CM

## 2021-06-17 ENCOUNTER — Other Ambulatory Visit: Payer: Self-pay

## 2021-06-17 ENCOUNTER — Ambulatory Visit: Payer: Managed Care, Other (non HMO) | Admitting: Allergy and Immunology

## 2021-06-17 VITALS — BP 110/64 | HR 74 | Resp 20 | Ht 63.0 in | Wt 148.0 lb

## 2021-06-17 DIAGNOSIS — T63481A Toxic effect of venom of other arthropod, accidental (unintentional), initial encounter: Secondary | ICD-10-CM

## 2021-06-17 DIAGNOSIS — L501 Idiopathic urticaria: Secondary | ICD-10-CM | POA: Diagnosis not present

## 2021-06-17 NOTE — Patient Instructions (Addendum)
  1.  Continue Omalizumab 300 mg every 4 weeks  2.  Discontinue Montelukast   3.  If needed:   A. Cetirizine 10 mg - 1-2 tablets 1-2 times per day (max=40mg )  B. Auvi-Q 0.3, Benadryl, MD/ER evaluation for allergic reaction  4. If doing well with hives on 29 September 2021, decrease omalizumab to 150 mg every 4 weeks  5. Obtain fall flu vaccine   6. Return to clinic in 12 months or earlier if problem

## 2021-06-17 NOTE — Progress Notes (Signed)
[place] - [place] - [place] - [place] - [place]   Follow-up Note   Referring Provider: Leane Call, PA-C Primary Provider: Augustine Radar Date of Office Visit: 06/17/2021  Subjective:   Christina Nixon (DOB: 01-12-1971) is a 50 y.o. female who [facility] in re-evaluation of the following:  HPI: Siravo returns to this clinic in reevaluation of chronic urticaria, hymenoptera venom hypersensitivity state, and a history of topical steroid induced facial rosacea.  She has really done well with her urticaria and has had absolutely no activity of this issue while using her omalizumab.  She has been able to discontinue all of her antihistamine.  She still continues on montelukast.  She continues with an injectable epinephrine device regarding her hymenoptera venom hypersensitivity state.  She has not been having any issues with rosacea and does not require any therapy at this point in time.  She has had 3 COVID vaccines.  Allergies as of 06/17/2021       Reactions   Bee Venom Hives   Aimovig [erenumab-aooe] Rash   Facial rash        Medication List    Ajovy 225 MG/1.5ML Sosy Generic drug: Fremanezumab-vfrm Inject 225 mg into the skin every 30 (thirty) days.   amphetamine-dextroamphetamine 20 MG tablet Commonly known as: ADDERALL Take 40 mg by mouth daily.   Auvi-Q 0.3 mg/0.3 mL Soaj injection Generic drug: EPINEPHrine Use as directed for life-threatening allergic reaction.   BENADRYL ALLERGY PO Take by mouth as needed.   cetirizine 10 MG tablet Commonly known as: ZYRTEC Take 10 mg by mouth as needed for allergies.   docusate sodium 100 MG capsule Commonly known as: Colace Take 1 capsule (100 mg total) by mouth 2 (two) times daily.   doxycycline 50 MG capsule Commonly known as: VIBRAMYCIN TAKE ONE CAPSULE ONCE DAILY AS DIRECTED   escitalopram 10 MG tablet Commonly known as:  LEXAPRO Take 10 mg by mouth daily.   gabapentin 300 MG capsule Commonly known as: NEURONTIN Take 1 capsule (300 mg total) by mouth at bedtime.   hydrOXYzine 25 MG tablet Commonly known as: ATARAX/VISTARIL TAKE 1 2 TABLETS BY MOUTH EVERY 4 6 HOURS AS NEEDED URTICARIA   ibuprofen 800 MG tablet Commonly known as: ADVIL Take 1 tablet (800 mg total) by mouth every 8 (eight) hours as needed.   levonorgestrel 20 MCG/24HR IUD Commonly known as: MIRENA 1 each by Intrauterine route once.   meloxicam 15 MG tablet Commonly known as: MOBIC TAKE 1 TABLET BY MOUTH EVERY DAY   metroNIDAZOLE 0.75 % cream Commonly known as: METROCREAM Apply to face twice daily   montelukast 10 MG tablet Commonly known as: SINGULAIR TAKE ONE TABLET ONCE DAILY AS DIRECTED   promethazine 12.5 MG tablet Commonly known as: PHENERGAN Take 1 tablet (12.5 mg total) by mouth every 8 (eight) hours as needed for nausea or vomiting.   Restasis 0.05 % ophthalmic emulsion Generic drug: cycloSPORINE INSTILL 1 DROP INTO BOTH EYES TWICE A DAY   SUMAtriptan 50 MG tablet Commonly known as: IMITREX TAKE 1 TABLET BY MOUTH AS NEEDED FOR MIGRAINE. MAY REPEAT IN 2 HRS IF HEADACHE PERSISTS OR RECURS. MAX 2 TABS/24 HOURS.   Xolair 150 MG injection Generic drug: omalizumab INJECT 300 MG UNDER THE SKIN EVERY 4 WEEKS    Past Medical History:  Diagnosis Date   Migraine    Urticaria     Past Surgical History:  Procedure Laterality  Date   THYROIDECTOMY, PARTIAL  1997    Review of systems negative except as noted in HPI / PMHx or noted below:  Review of Systems  Constitutional: Negative.   HENT: Negative.    Eyes: Negative.   Respiratory: Negative.    Cardiovascular: Negative.   Gastrointestinal: Negative.   Genitourinary: Negative.   Musculoskeletal: Negative.   Skin: Negative.   Neurological: Negative.   Endo/Heme/Allergies: Negative.   Psychiatric/Behavioral: Negative.      Objective:   Vitals:    06/17/21 1715  BP: 110/64  Pulse: 74  Resp: 20  SpO2: 100%   Height: 5\' 3"  (160 cm)  Weight: 148 lb (67.1 kg)   Physical Exam Constitutional:      Appearance: She is not diaphoretic.  HENT:     Head: Normocephalic.     Right Ear: Tympanic membrane, ear canal and external ear normal.     Left Ear: Tympanic membrane, ear canal and external ear normal.     Nose: Nose normal. No mucosal edema or rhinorrhea.     Mouth/Throat:     Pharynx: Uvula midline. No oropharyngeal exudate.  Eyes:     Conjunctiva/sclera: Conjunctivae normal.  Neck:     Thyroid: No thyromegaly.     Trachea: Trachea normal. No tracheal tenderness or tracheal deviation.  Cardiovascular:     Rate and Rhythm: Normal rate and regular rhythm.     Heart sounds: Normal heart sounds, S1 normal and S2 normal. No murmur heard. Pulmonary:     Effort: No respiratory distress.     Breath sounds: Normal breath sounds. No stridor. No wheezing or rales.  Lymphadenopathy:     Head:     Right side of head: No tonsillar adenopathy.     Left side of head: No tonsillar adenopathy.     Cervical: No cervical adenopathy.  Skin:    Findings: No erythema or rash.     Nails: There is no clubbing.  Neurological:     Mental Status: She is alert.    Diagnostics: none  Assessment and Plan:   1. Idiopathic urticaria   2. 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  Continue Omalizumab 300 mg every 4 weeks  2.  Discontinue Montelukast   3.  If needed:   A. Cetirizine 10 mg - 1-2 tablets 1-2 times per day (max=40mg )  B. Auvi-Q 0.3, Benadryl, MD/ER evaluation for allergic reaction  4. If doing well with hives on 29 September 2021, decrease omalizumab to 150 mg every 4 weeks  5. Obtain fall flu vaccine   6. Return to clinic in 12 months or earlier if problem   [name] appears to be doing very well and I think there is an opportunity to consolidate some of her medical treatment and will stop by discontinuing her  montelukast.  If she continues to do well for the next 12 weeks then she can decrease her omalizumab to 150 mg administered every 4 weeks.  I will see her back in this clinic in 1 year or earlier if there is a problem.  01 October 2021, MD Allergy / Immunology [facility]

## 2021-06-18 ENCOUNTER — Encounter: Payer: Self-pay | Admitting: Allergy and Immunology

## 2021-06-20 ENCOUNTER — Ambulatory Visit: Payer: Managed Care, Other (non HMO)

## 2021-07-14 ENCOUNTER — Other Ambulatory Visit: Payer: Self-pay | Admitting: Allergy and Immunology

## 2021-07-16 ENCOUNTER — Other Ambulatory Visit: Payer: Self-pay

## 2021-07-16 ENCOUNTER — Ambulatory Visit: Payer: Managed Care, Other (non HMO) | Admitting: Sports Medicine

## 2021-07-16 ENCOUNTER — Ambulatory Visit (INDEPENDENT_AMBULATORY_CARE_PROVIDER_SITE_OTHER): Payer: Managed Care, Other (non HMO)

## 2021-07-16 ENCOUNTER — Encounter: Payer: Self-pay | Admitting: Sports Medicine

## 2021-07-16 DIAGNOSIS — M79671 Pain in right foot: Secondary | ICD-10-CM

## 2021-07-16 DIAGNOSIS — M722 Plantar fascial fibromatosis: Secondary | ICD-10-CM | POA: Diagnosis not present

## 2021-07-16 DIAGNOSIS — M79672 Pain in left foot: Secondary | ICD-10-CM

## 2021-07-16 MED ORDER — TRIAMCINOLONE ACETONIDE 10 MG/ML IJ SUSP
10.0000 mg | Freq: Once | INTRAMUSCULAR | Status: AC
  Administered 2021-07-16: 10 mg

## 2021-07-16 NOTE — Progress Notes (Signed)
Subjective: Christina Nixon is a 50 y.o. female returns to office for R>L heel pain that flare up stated back again in August and has not gotten better.  Reports that she had a really bad flareup after doing yard work on Saturday and the right 1 is more painful.  Patient denies any other pedal complaints at this time.  Patient Active Problem List   Diagnosis Date Noted   Migraine without aura and with status migrainosus, not intractable 07/19/2018   New daily persistent headache 07/05/2018   Attention deficit disorder (ADD) without hyperactivity 03/05/2018   Essential hypertension 03/05/2018   Family history of MI (myocardial infarction) 03/05/2018   Atypical chest pain 01/10/2018   Breakthrough bleeding with IUD 01/10/2018   Encounter for annual routine gynecological examination 01/10/2018    Current Outpatient Medications on File Prior to Visit  Medication Sig Dispense Refill   amphetamine-dextroamphetamine (ADDERALL) 20 MG tablet Take 40 mg by mouth daily.     AUVI-Q 0.3 MG/0.3ML SOAJ injection Use as directed for life-threatening allergic reaction. 2 each 3   cetirizine (ZYRTEC) 10 MG tablet Take 10 mg by mouth as needed for allergies. (Patient not taking: Reported on 06/17/2021)     diphenhydrAMINE HCl (BENADRYL ALLERGY PO) Take by mouth as needed. (Patient not taking: Reported on 06/17/2021)     docusate sodium (COLACE) 100 MG capsule Take 1 capsule (100 mg total) by mouth 2 (two) times daily. (Patient not taking: Reported on 06/17/2021) 10 capsule 0   doxycycline (VIBRAMYCIN) 50 MG capsule TAKE ONE CAPSULE ONCE DAILY AS DIRECTED 30 capsule 3   escitalopram (LEXAPRO) 10 MG tablet Take 10 mg by mouth daily.     Fremanezumab-vfrm (AJOVY) 225 MG/1.5ML SOSY Inject 225 mg into the skin every 30 (thirty) days. 1.5 mL 11   gabapentin (NEURONTIN) 300 MG capsule Take 1 capsule (300 mg total) by mouth at bedtime. 90 capsule 3   hydrOXYzine (ATARAX/VISTARIL) 25 MG tablet TAKE 1 2 TABLETS BY MOUTH  EVERY 4 6 HOURS AS NEEDED URTICARIA (Patient not taking: Reported on 06/17/2021)     ibuprofen (ADVIL) 800 MG tablet Take 1 tablet (800 mg total) by mouth every 8 (eight) hours as needed. 30 tablet 0   levonorgestrel (MIRENA) 20 MCG/24HR IUD 1 each by Intrauterine route once.     meloxicam (MOBIC) 15 MG tablet TAKE 1 TABLET BY MOUTH EVERY DAY (Patient taking differently: Take 15 mg by mouth daily as needed.) 90 tablet 0   metroNIDAZOLE (METROCREAM) 0.75 % cream Apply to face twice daily (Patient not taking: Reported on 06/17/2021) 45 g 5   montelukast (SINGULAIR) 10 MG tablet TAKE ONE TABLET ONCE DAILY AS DIRECTED 90 tablet 3   promethazine (PHENERGAN) 12.5 MG tablet Take 1 tablet (12.5 mg total) by mouth every 8 (eight) hours as needed for nausea or vomiting. 20 tablet 0   RESTASIS 0.05 % ophthalmic emulsion INSTILL 1 DROP INTO BOTH EYES TWICE A DAY     SUMAtriptan (IMITREX) 50 MG tablet TAKE 1 TABLET BY MOUTH AS NEEDED FOR MIGRAINE. MAY REPEAT IN 2 HRS IF HEADACHE PERSISTS OR RECURS. MAX 2 TABS/24 HOURS. 9 tablet 2   triamcinolone cream (KENALOG) 0.1 % APPLY 3 TIMES DAILY AS NEEDED TO RASH (Patient not taking: Reported on 06/17/2021)     XOLAIR 150 MG injection INJECT 300 MG UNDER THE SKIN EVERY 4 WEEKS 2 each 11   Current Facility-Administered Medications on File Prior to Visit  Medication Dose Route Frequency Provider Last Rate  Last Admin   omalizumab Geoffry Paradise) injection 300 mg  300 mg Subcutaneous Q28 days Jessica Priest, MD   300 mg at 05/23/21 3825    Allergies  Allergen Reactions   Bee Venom Hives   Aimovig [Erenumab-Aooe] Rash    Facial rash    Objective:   General:  Alert and oriented x 3, in no acute distress  Dermatology: Skin is warm, dry, and supple bilateral. Nails are within normal limits. There is no lower extremity erythema, no eccymosis, no open lesions present bilateral. Old scar at right 1st toe well healed.   Vascular: Dorsalis Pedis and Posterior Tibial pedal pulses  are 1/4 bilateral. + hair growth noted bilateral. Capillary Fill Time is 3 seconds in all digits. No varicosities, No edema bilateral lower extremities.   Neurological: Sensation grossly intact to light touch bilateral.   Musculoskeletal: There is tenderness to palpation at the medial calcaneal tubercale and through the insertion of the plantar fascia on the right>left foot. No pain with compression to calcaneus or application of tuning fork. There is decreased Ankle joint range of motion bilateral. All other jointsrange of motion  within normal limits bilateral. Strength 5/5 bilateral.   Assessment and Plan: Problem List Items Addressed This Visit   None Visit Diagnoses     Bilateral plantar fasciitis    -  Primary   Relevant Orders   DG Foot Complete Right   DG Foot Complete Left   Heel pain, bilateral           -Complete examination performed.  -X-rays consistent enthesis at plantar heel consistent with Planter fasciitis no other acute osseous abnormalities -Discussed with patient in detail the condition of plantar fasciitis, how this  occurs related to the foot type of the patient and general treatment options. - Patient opted for another injection today; After oral consent and aseptic prep, injected a mixture containing 1 ml of 1%plain lidocaine, 1 ml 0.5% plain marcaine, 0.5 ml of kenalog 10 and 0.5 ml of dexmethasone phosphate to left and right heel at area of most pain/trigger point injection. -Applied plantar fascial taping bilateral and advised patient to keep intact for 3 to 5 days or as long as tolerated -Advised patient to continue with night splint -Continue with stretching, icing, good supportive shoes daily -Discussed long term care and reocurrence; will closely monitor; if fails to improve will consider other treatment modalities.  -Patient to return to office in 1 month for follow up or sooner if problems or questions arise.  Asencion Islam, DPM

## 2021-07-22 ENCOUNTER — Ambulatory Visit (INDEPENDENT_AMBULATORY_CARE_PROVIDER_SITE_OTHER): Payer: Managed Care, Other (non HMO) | Admitting: *Deleted

## 2021-07-22 ENCOUNTER — Other Ambulatory Visit: Payer: Self-pay

## 2021-07-22 DIAGNOSIS — L501 Idiopathic urticaria: Secondary | ICD-10-CM

## 2021-08-19 ENCOUNTER — Ambulatory Visit: Payer: Managed Care, Other (non HMO)

## 2021-08-21 ENCOUNTER — Other Ambulatory Visit: Payer: Self-pay

## 2021-08-21 ENCOUNTER — Ambulatory Visit (INDEPENDENT_AMBULATORY_CARE_PROVIDER_SITE_OTHER): Payer: Managed Care, Other (non HMO) | Admitting: *Deleted

## 2021-08-21 DIAGNOSIS — L501 Idiopathic urticaria: Secondary | ICD-10-CM

## 2021-09-06 ENCOUNTER — Ambulatory Visit: Payer: Managed Care, Other (non HMO) | Admitting: Sports Medicine

## 2021-09-06 ENCOUNTER — Encounter: Payer: Self-pay | Admitting: Sports Medicine

## 2021-09-06 DIAGNOSIS — M722 Plantar fascial fibromatosis: Secondary | ICD-10-CM | POA: Diagnosis not present

## 2021-09-06 DIAGNOSIS — M79672 Pain in left foot: Secondary | ICD-10-CM

## 2021-09-06 DIAGNOSIS — M79671 Pain in right foot: Secondary | ICD-10-CM

## 2021-09-06 MED ORDER — TRIAMCINOLONE ACETONIDE 10 MG/ML IJ SUSP: 10.0000 mg | Freq: Once | INTRAMUSCULAR | Status: AC

## 2021-09-06 NOTE — Progress Notes (Signed)
Subjective: Rebeccah Neomia Herbel is a 50 y.o. female returns to office for R>L heel pain patient reports that after last visit the injection helped her left heel and feels much better but still has a little bit of pain on the right states that the shots and taping helped.  Patient does admit that she has not been consistent with icing or stretching and has not been wearing her custom orthotics that she got from before.  Patient also admits that she is concerned about her right great toenail and how it is still growing out and lifting up and getting dog hair underneath.  Patient denies any other pedal complaints at this time.  Patient denies any other pedal complaints.  Patient Active Problem List   Diagnosis Date Noted   Migraine without aura and with status migrainosus, not intractable 07/19/2018   New daily persistent headache 07/05/2018   Attention deficit disorder (ADD) without hyperactivity 03/05/2018   Essential hypertension 03/05/2018   Family history of MI (myocardial infarction) 03/05/2018   Atypical chest pain 01/10/2018   Breakthrough bleeding with IUD 01/10/2018   Encounter for annual routine gynecological examination 01/10/2018    Current Outpatient Medications on File Prior to Visit  Medication Sig Dispense Refill   amphetamine-dextroamphetamine (ADDERALL) 20 MG tablet Take 40 mg by mouth daily.     AUVI-Q 0.3 MG/0.3ML SOAJ injection Use as directed for life-threatening allergic reaction. 2 each 3   cetirizine (ZYRTEC) 10 MG tablet Take 10 mg by mouth as needed for allergies. (Patient not taking: Reported on 06/17/2021)     diphenhydrAMINE HCl (BENADRYL ALLERGY PO) Take by mouth as needed. (Patient not taking: Reported on 06/17/2021)     docusate sodium (COLACE) 100 MG capsule Take 1 capsule (100 mg total) by mouth 2 (two) times daily. (Patient not taking: Reported on 06/17/2021) 10 capsule 0   doxycycline (VIBRAMYCIN) 50 MG capsule TAKE ONE CAPSULE ONCE DAILY AS DIRECTED 30 capsule 3    escitalopram (LEXAPRO) 10 MG tablet Take 10 mg by mouth daily.     FLOWFLEX COVID-19 AG HOME TEST KIT      Fremanezumab-vfrm (AJOVY) 225 MG/1.5ML SOSY Inject 225 mg into the skin every 30 (thirty) days. 1.5 mL 11   gabapentin (NEURONTIN) 300 MG capsule Take 1 capsule (300 mg total) by mouth at bedtime. 90 capsule 3   hydrOXYzine (ATARAX/VISTARIL) 25 MG tablet TAKE 1 2 TABLETS BY MOUTH EVERY 4 6 HOURS AS NEEDED URTICARIA (Patient not taking: Reported on 06/17/2021)     ibuprofen (ADVIL) 800 MG tablet Take 1 tablet (800 mg total) by mouth every 8 (eight) hours as needed. 30 tablet 0   levonorgestrel (MIRENA) 20 MCG/24HR IUD 1 each by Intrauterine route once.     meloxicam (MOBIC) 15 MG tablet TAKE 1 TABLET BY MOUTH EVERY DAY (Patient taking differently: Take 15 mg by mouth daily as needed.) 90 tablet 0   metroNIDAZOLE (METROCREAM) 0.75 % cream Apply to face twice daily (Patient not taking: Reported on 06/17/2021) 45 g 5   montelukast (SINGULAIR) 10 MG tablet TAKE ONE TABLET ONCE DAILY AS DIRECTED 90 tablet 3   promethazine (PHENERGAN) 12.5 MG tablet Take 1 tablet (12.5 mg total) by mouth every 8 (eight) hours as needed for nausea or vomiting. 20 tablet 0   RESTASIS 0.05 % ophthalmic emulsion INSTILL 1 DROP INTO BOTH EYES TWICE A DAY     SUMAtriptan (IMITREX) 50 MG tablet TAKE 1 TABLET BY MOUTH AS NEEDED FOR MIGRAINE. MAY REPEAT IN 2  HRS IF HEADACHE PERSISTS OR RECURS. MAX 2 TABS/24 HOURS. 9 tablet 2   triamcinolone cream (KENALOG) 0.1 % APPLY 3 TIMES DAILY AS NEEDED TO RASH (Patient not taking: Reported on 06/17/2021)     XOLAIR 150 MG injection INJECT 300 MG UNDER THE SKIN EVERY 4 WEEKS 2 each 11   Current Facility-Administered Medications on File Prior to Visit  Medication Dose Route Frequency Provider Last Rate Last Admin   omalizumab Arvid Right) injection 300 mg  300 mg Subcutaneous Q28 days Kozlow, Donnamarie Poag, MD   300 mg at 08/21/21 1635    Allergies  Allergen Reactions   Bee Venom Hives   Aimovig  [Erenumab-Aooe] Rash    Facial rash    Objective:   General:  Alert and oriented x 3, in no acute distress  Dermatology: Skin is warm, dry, and supple bilateral. Nails are within normal limits there is partial detachment of the distal aspect of the right hallux nail patient has self trimmed it very short no current subungual debris or acute signs of infection to the area. There is no lower extremity erythema, no eccymosis, no open lesions present bilateral. Old scar at right 1st toe well healed.   Vascular: Dorsalis Pedis and Posterior Tibial pedal pulses are 1/4 bilateral. + hair growth noted bilateral. Capillary Fill Time is 3 seconds in all digits. No varicosities, No edema bilateral lower extremities.   Neurological: Sensation grossly intact to light touch bilateral.   Musculoskeletal: There is tenderness to palpation at the medial calcaneal tubercale and through the insertion of the plantar fascia on the right and no pain to the left foot. No pain with compression to calcaneus or application of tuning fork. There is decreased Ankle joint range of motion bilateral. All other jointsrange of motion  within normal limits bilateral. Strength 5/5 bilateral.   Assessment and Plan: Problem List Items Addressed This Visit   None Visit Diagnoses     Pain of right heel    -  Primary   Pain of left heel       Plantar fasciitis, right       Plantar fasciitis, left          -Complete examination performed.  -Re-Discussed with patient in detail the condition of plantar fasciitis, how this occurs related to the foot type of the patient and general treatment options. - Patient opted for another injection today; After oral consent and aseptic prep, injected a mixture containing 1 ml of 1%plain lidocaine, 1 ml 0.5% plain marcaine, 0.5 ml of kenalog 10 and 0.5 ml of dexmethasone phosphate to right heel at area of most pain/trigger point injection. -Advised patient to resume using her custom  functional foot orthotics at slow 30-minute increments per day -Advised patient to resume a regimen of daily stretching and icing as directed -Advised patient at a later visit we can further talk about toenail procedure/temporary removal of the right hallux nail however at this time her heel pain takes priority -Patient to return to office in 4 to 6 weeks for follow up or sooner if problems or questions arise.  Landis Martins, DPM

## 2021-09-18 ENCOUNTER — Ambulatory Visit: Payer: Managed Care, Other (non HMO)

## 2021-09-19 ENCOUNTER — Ambulatory Visit: Payer: Managed Care, Other (non HMO)

## 2021-10-18 ENCOUNTER — Ambulatory Visit: Payer: Managed Care, Other (non HMO) | Admitting: Sports Medicine

## 2021-10-23 ENCOUNTER — Ambulatory Visit (INDEPENDENT_AMBULATORY_CARE_PROVIDER_SITE_OTHER): Payer: Managed Care, Other (non HMO) | Admitting: *Deleted

## 2021-10-23 ENCOUNTER — Other Ambulatory Visit: Payer: Self-pay

## 2021-10-23 DIAGNOSIS — L501 Idiopathic urticaria: Secondary | ICD-10-CM

## 2021-11-12 ENCOUNTER — Telehealth: Payer: Self-pay | Admitting: Family Medicine

## 2021-11-12 NOTE — Telephone Encounter (Signed)
Pt has been informed by her insurance company that a PA is needed on her :Fremanezumab-vfrm (AJOVY) 225 MG/1.5ML SOSY please call.  The Rx company is [employer] # [id number]

## 2021-11-12 NOTE — Telephone Encounter (Signed)
PA submitted on CMM. Key: [id number]. Received instant approval: "CaseId:[id number];Status:Approved;Review Type:Prior Auth;Coverage Start Date:10/13/2021;Coverage End Date:11/12/2022;"

## 2021-11-12 NOTE — Telephone Encounter (Signed)
LVM with this info and asked her to call back to schedule

## 2021-11-20 ENCOUNTER — Ambulatory Visit: Payer: Managed Care, Other (non HMO)

## 2022-01-09 ENCOUNTER — Ambulatory Visit (INDEPENDENT_AMBULATORY_CARE_PROVIDER_SITE_OTHER): Payer: Managed Care, Other (non HMO) | Admitting: *Deleted

## 2022-01-09 DIAGNOSIS — L501 Idiopathic urticaria: Secondary | ICD-10-CM | POA: Diagnosis not present

## 2022-01-27 ENCOUNTER — Telehealth: Payer: Self-pay | Admitting: Family Medicine

## 2022-01-27 DIAGNOSIS — G43009 Migraine without aura, not intractable, without status migrainosus: Secondary | ICD-10-CM

## 2022-01-27 MED ORDER — AJOVY 225 MG/1.5ML ~~LOC~~ SOSY: 225.0000 mg | PREFILLED_SYRINGE | SUBCUTANEOUS | 1 refills | Status: DC

## 2022-01-27 NOTE — Addendum Note (Signed)
Addended by: Arther Abbott on: 01/27/2022 01:49 PM ? ? Modules accepted: Orders ? ?

## 2022-01-27 NOTE — Telephone Encounter (Signed)
Pt request refill for Fremanezumab-vfrm (AJOVY) 225 MG/1.5ML SOSY at [id number] ?

## 2022-01-27 NOTE — Telephone Encounter (Signed)
Pt reports no medication issues, no neurological issues. Scheduled appt for 03/04/22 at 9:30am with Dr. Lucia Gaskins. ?Refilled medication per pt request until appt next month.  ? ? ?

## 2022-02-03 ENCOUNTER — Other Ambulatory Visit: Payer: Self-pay | Admitting: Allergy and Immunology

## 2022-02-06 ENCOUNTER — Ambulatory Visit: Payer: Managed Care, Other (non HMO)

## 2022-03-04 ENCOUNTER — Telehealth: Payer: Managed Care, Other (non HMO) | Admitting: Neurology

## 2022-03-04 DIAGNOSIS — G43709 Chronic migraine without aura, not intractable, without status migrainosus: Secondary | ICD-10-CM

## 2022-03-04 DIAGNOSIS — G43009 Migraine without aura, not intractable, without status migrainosus: Secondary | ICD-10-CM

## 2022-03-04 MED ORDER — AJOVY 225 MG/1.5ML ~~LOC~~ SOSY: 225.0000 mg | PREFILLED_SYRINGE | SUBCUTANEOUS | 1 refills | Status: DC

## 2022-03-04 NOTE — Progress Notes (Signed)
[facility]    Provider:  Dr Jaynee Eagles Referring Provider: Montine Circle PA-C Primary Care Physician:  Maggie Schwalbe, PA-C  CC:  headache  Virtual Visit via Video Note  I connected with Christina Nixon on 03/04/22 at  9:30 AM EDT by a video enabled telemedicine application and verified that I am speaking with the correct person using two identifiers.  Location: Patient: home Provider: office   I discussed the limitations of evaluation and management by telemedicine and the availability of in person appointments. The patient expressed understanding and agreed to proceed.   Follow Up Instructions:    I discussed the assessment and treatment plan with the patient. The patient was provided an opportunity to ask questions and all were answered. The patient agreed with the plan and demonstrated an understanding of the instructions.   The patient was advised to call back or seek an in-person evaluation if the symptoms worsen or if the condition fails to improve as anticipated.  I provided 15 minutes of non-face-to-face time during this encounter.   Melvenia Beam, MD   03/04/2022: We saw her in 2019 for intractable headaches. She was given a medrol dosepak, zembrace, frovatriptan, had trigger point injections, we tried topamax up to 140m, tried propranolol, She was started on Aimovig and had a reaction, she was then started on Ajovy and did well. 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 She has a new position which is less stressful which helps. She really doesn't have any headaches and if she gets a headache from no sleep she may take something OTC that works but not migraines more headaches from stress which are also much improvement and occassional.  Patient complains of symptoms per HPI as well as the following symptoms: none . Pertinent negatives and  positives per HPI. All others negative   HPI 07/15/2018:  Christina Nixon a 51y.o. female here as requested by Dr. NClint Bolderfor migraine. She does not have a history of significant headaches. Started on Wednesday October 2nd in the frontal area, pounding,throbbing, continuous, no light sensitivity no sound sensitivity, +nausea, ear ringing. She thought it was sinusitis and started on pseudephedrine and nasal spray. Tylenol didn't help. She had scalp sensitivity. On October 5th she went to a festival and took meloxicam, it was hard for her to sleep and she took a sleep aid to see if it would help and it did not. She tried goody powder on the 8th. She has tried multiple things, [facility], worse with movement, better with sitting still. On the 7th she feels it went to a 7/10, pounding, checked blood pressure, Toradol did not help. She is very active, has tried multiple other options including BC powders, sumatriptan took it on 10/9 did not help and took a second dose. It became a 10/10, it made her cry, severe pain. On October 10th she felt well but it came back in the afternoon. She stayed home on the 11th took 2 alleve no help. A massage did not help. She saw her pcp on the 11th again she took 1089msumatriptan, 2 alleve then took 2 more sumatriptan (50109mill). Saturday the 12th she felt a little better. She tried multiple other things. Her sister has migraines. She went to the ER on Saturday. On the 13th she had treatment in the ED  and she felt better but came back the next day.  She started Topiramate 14th. The headache came back the 15th, Has been an 8. Yesterday was a 2-3 in the morning and increased throughout the day to an 8/9. Right now   Reviewed notes, labs and personally reviewed imaging from outside physicians, which showed:  tsh nml   MRI/MRA/MRV 07/11/2018: 1. Nonspecific scattered foci of white matter hyperintensity. Though this may be seen in the setting of  migraine headaches or vasculopathy such as early chronic small vessel disease, it is also seen in normal patients of this age group. 2. Normal intracranial MRA/MRV.  Review of Systems: Patient complains of symptoms per HPI as well as the following symptoms: headache. Pertinent negatives and positives per HPI. All others negative.   Social History   Socioeconomic History   Marital status: Divorced    Spouse name: Not on file   Number of children: 0   Years of education: Not on file   Highest education level: Bachelor's degree (e.g., BA, AB, BS)  Occupational History   Not on file  Tobacco Use   Smoking status: Never   Smokeless tobacco: Never  Vaping Use   Vaping Use: Never used  Substance and Sexual Activity   Alcohol use: Yes    Alcohol/week: 14.0 standard drinks    Types: 14 Cans of beer per week    Comment: daily   Drug use: Never   Sexual activity: Not on file  Other Topics Concern   Not on file  Social History Narrative   Lives at home alone    Right handed   Caffeine: 2 cups daily   Social Determinants of Health   Financial Resource Strain: Not on file  Food Insecurity: Not on file  Transportation Needs: Not on file  Physical Activity: Not on file  Stress: Not on file  Social Connections: Not on file  Intimate Partner Violence: Not on file    Family History  Problem Relation Age of Onset   COPD Mother    Arthritis Mother    Other Mother        gastric dumping syndrome    Kidney disease Mother        stage 3   High Cholesterol Mother    Lung cancer Father    Cancer Sister        external female cancer    Diabetes Sister    Arthritis Sister    Other Sister        gastric dumping syndrome    High blood pressure Sister    Migraines Sister     Past Medical History:  Diagnosis Date   Migraine    Urticaria     Past Surgical History:  Procedure Laterality Date   THYROIDECTOMY, PARTIAL  1997    Current Outpatient Medications  Medication  Sig Dispense Refill   amphetamine-dextroamphetamine (ADDERALL) 20 MG tablet Take 40 mg by mouth daily.     AUVI-Q 0.3 MG/0.3ML SOAJ injection Use as directed for life-threatening allergic reaction. 2 each 3   escitalopram (LEXAPRO) 10 MG tablet Take 10 mg by mouth daily.     FLOWFLEX COVID-19 AG HOME TEST KIT      Fremanezumab-vfrm (AJOVY) 225 MG/1.5ML SOSY Inject 225 mg into the skin every 30 (thirty) days. Must keep follow up 03/04/22 for ongoing refills 1.5 mL 1   levonorgestrel (MIRENA) 20 MCG/24HR IUD 1 each by Intrauterine route once.     meloxicam (MOBIC) 15 MG tablet  TAKE 1 TABLET BY MOUTH EVERY DAY (Patient taking differently: Take 15 mg by mouth daily as needed.) 90 tablet 0   montelukast (SINGULAIR) 10 MG tablet TAKE ONE TABLET ONCE DAILY AS DIRECTED 90 tablet 3   RESTASIS 0.05 % ophthalmic emulsion INSTILL 1 DROP INTO BOTH EYES TWICE A DAY     XOLAIR 150 MG injection INJECT 300 MG UNDER THE SKIN EVERY 4 WEEKS 2 each 11   Current Facility-Administered Medications  Medication Dose Route Frequency Provider Last Rate Last Admin   omalizumab Arvid Right) injection 300 mg  300 mg Subcutaneous Q28 days Kozlow, Donnamarie Poag, MD   300 mg at 01/09/22 1614   triamcinolone acetonide (KENALOG) 10 MG/ML injection 10 mg  10 mg Other Once Landis Martins, DPM        Allergies as of 03/04/2022 - Review Complete 09/06/2021  Allergen Reaction Noted   Bee venom Hives 10/26/2020   Aimovig [erenumab-aooe] Rash 10/26/2018    Vitals: There were no vitals taken for this visit. Last Weight:  Wt Readings from Last 1 Encounters:  06/17/21 148 lb (67.1 kg)   Last Height:   Ht Readings from Last 1 Encounters:  06/17/21 5' 3"  (1.6 m)    Physical exam: Exam: Gen: NAD, conversant      CV:  Denies palpitations or chest pain or SOB. VS: Breathing at a normal rate. Weight appears within normal limits. Not febrile. Eyes: Conjunctivae clear without exudates or hemorrhage  Neuro: Detailed Neurologic  Exam  Speech:    Speech is normal; fluent and spontaneous with normal comprehension.  Cognition:    The patient is oriented to person, place, and time;     recent and remote memory intact;     language fluent;     normal attention, concentration,     fund of knowledge Cranial Nerves:    The pupils are equal, round, and reactive to light.  Visual fields are full to finger confrontation. Extraocular movements are intact.  The face is symmetric with normal sensation. The palate elevates in the midline. Hearing intact. Voice is normal. Shoulder shrug is normal. The tongue has normal motion without fasciculations.   Coordination:    Normal finger to nose  Gait:    Normal native gait  Motor Observation:   no involuntary movements noted. Tone:    Appears normal  Posture:    Posture is normal. normal erect    Strength:    Strength is anti-gravity and symmetric in the upper and lower limbs.      Sensation: intact to LT        Assessment/Plan:  Patient with chronic migraines doing great on Ajovy, vitually no more migraines and rare mild headaches    Sarina Ill, MD  [facility] [address] [place], [place] [address]  Phone [phone] Fax [phone]

## 2022-03-06 ENCOUNTER — Ambulatory Visit (INDEPENDENT_AMBULATORY_CARE_PROVIDER_SITE_OTHER): Payer: Managed Care, Other (non HMO) | Admitting: *Deleted

## 2022-03-06 DIAGNOSIS — L501 Idiopathic urticaria: Secondary | ICD-10-CM | POA: Diagnosis not present

## 2022-04-03 ENCOUNTER — Ambulatory Visit: Payer: Managed Care, Other (non HMO)

## 2022-05-21 ENCOUNTER — Ambulatory Visit: Payer: Managed Care, Other (non HMO)

## 2022-05-22 ENCOUNTER — Ambulatory Visit (INDEPENDENT_AMBULATORY_CARE_PROVIDER_SITE_OTHER): Payer: Managed Care, Other (non HMO) | Admitting: *Deleted

## 2022-05-22 DIAGNOSIS — L501 Idiopathic urticaria: Secondary | ICD-10-CM

## 2022-06-16 ENCOUNTER — Other Ambulatory Visit: Payer: Self-pay | Admitting: Neurology

## 2022-06-16 DIAGNOSIS — G43709 Chronic migraine without aura, not intractable, without status migrainosus: Secondary | ICD-10-CM

## 2022-06-18 ENCOUNTER — Encounter: Payer: Self-pay | Admitting: Allergy and Immunology

## 2022-06-18 ENCOUNTER — Ambulatory Visit: Payer: Managed Care, Other (non HMO) | Admitting: Allergy and Immunology

## 2022-06-18 DIAGNOSIS — J3089 Other allergic rhinitis: Secondary | ICD-10-CM | POA: Diagnosis not present

## 2022-06-18 DIAGNOSIS — T63481A Toxic effect of venom of other arthropod, accidental (unintentional), initial encounter: Secondary | ICD-10-CM | POA: Diagnosis not present

## 2022-06-18 DIAGNOSIS — L501 Idiopathic urticaria: Secondary | ICD-10-CM

## 2022-06-18 MED ORDER — AUVI-Q 0.3 MG/0.3ML IJ SOAJ: INTRAMUSCULAR | 1 refills | Status: AC

## 2022-06-18 NOTE — Progress Notes (Unsigned)
[facility] [place] [facility] [place]   Follow-up Note  Referring Provider: Maggie Schwalbe, PA-C Primary Provider: Waldemar Dickens Date of Office Visit: 06/18/2022  Subjective:   Christina Nixon (DOB: 25-Dec-1970) is a 51 y.o. female who returns to the [facility] on 06/18/2022 in re-evaluation of the following:  HPI: Derin Matthes returns to this clinic in reevaluation of chronic urticaria, hymenoptera venom hypersensitivity state.  I last saw her in this clinic 17 June 2021.  As long as she remains on on the Lizza Mab she has no problems with chronic urticaria.  During her last visit we made the recommendation that she could decrease her dose from 300 mg every 28 days to 150 mg every 28 days.  She feels very uncomfortable decreasing her omalizumab dose based upon the fact that her urticaria was very intense in the past.  We also made the recommendation during her last visit that she could stop montelukast but she actually feels better using montelukast whether this be because of a little less skin activity or a little bit less nasal activity tied up with some of her rhinitis.  She would like to remain on montelukast.  She has an injectable epinephrine device for hymenoptera venom hypersensitivity state.  Allergies as of 06/18/2022       Reactions   Bee Venom Hives   Aimovig [erenumab-aooe] Rash   Facial rash        Medication List    Ajovy 225 MG/1.5ML Sosy Generic drug: Fremanezumab-vfrm Inject 225 mg into the skin every 30 (thirty) days. Must keep follow up 03/04/22 for ongoing refills   amphetamine-dextroamphetamine 30 MG tablet Commonly known as: ADDERALL Take 1 tablet by mouth 2 (two) times daily.   Auvi-Q 0.3 mg/0.3 mL Soaj injection Generic drug: EPINEPHrine Use as directed for life-threatening allergic reaction.   betamethasone valerate 0.1 % cream Commonly known as: VALISONE Apply topically daily.    escitalopram 20 MG tablet Commonly known as: LEXAPRO Take 20 mg by mouth daily.   levonorgestrel 20 MCG/24HR IUD Commonly known as: MIRENA 1 each by Intrauterine route once.   meloxicam 15 MG tablet Commonly known as: MOBIC TAKE 1 TABLET BY MOUTH EVERY DAY   montelukast 10 MG tablet Commonly known as: SINGULAIR TAKE ONE TABLET ONCE DAILY AS DIRECTED   Restasis 0.05 % ophthalmic emulsion Generic drug: cycloSPORINE INSTILL 1 DROP INTO BOTH EYES TWICE A DAY   valACYclovir 500 MG tablet Commonly known as: VALTREX Take 500 mg by mouth daily.   Xolair 150 MG injection Generic drug: omalizumab INJECT 300 MG UNDER THE SKIN EVERY 4 WEEKS    Past Medical History:  Diagnosis Date   Migraine    Urticaria     Past Surgical History:  Procedure Laterality Date   THYROIDECTOMY, PARTIAL  1997    Review of systems negative except as noted in HPI / PMHx or noted below:  Review of Systems  Constitutional: Negative.   HENT: Negative.    Eyes: Negative.   Respiratory: Negative.    Cardiovascular: Negative.   Gastrointestinal: Negative.   Genitourinary: Negative.   Musculoskeletal: Negative.   Skin: Negative.   Neurological: Negative.   Endo/Heme/Allergies: Negative.   Psychiatric/Behavioral: Negative.       Objective:     Physical Exam Constitutional:      Appearance: She is not diaphoretic.  HENT:     Head: Normocephalic.     Right Ear: Tympanic membrane,  ear canal and external ear normal.     Left Ear: Tympanic membrane, ear canal and external ear normal.     Nose: Nose normal. No mucosal edema or rhinorrhea.     Mouth/Throat:     Pharynx: Uvula midline. No oropharyngeal exudate.  Eyes:     Conjunctiva/sclera: Conjunctivae normal.  Neck:     Thyroid: No thyromegaly.     Trachea: Trachea normal. No tracheal tenderness or tracheal deviation.  Cardiovascular:     Rate and Rhythm: Normal rate and regular rhythm.     Heart sounds: Normal heart sounds, S1 normal  and S2 normal. No murmur heard. Pulmonary:     Effort: No respiratory distress.     Breath sounds: Normal breath sounds. No stridor. No wheezing or rales.  Lymphadenopathy:     Head:     Right side of head: No tonsillar adenopathy.     Left side of head: No tonsillar adenopathy.     Cervical: No cervical adenopathy.  Skin:    Findings: No erythema or rash.     Nails: There is no clubbing.  Neurological:     Mental Status: She is alert.     Diagnostics: none  Assessment and Plan:   1. Idiopathic urticaria   2. 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  Continue Omalizumab 150 - 300 mg every 4 weeks  2.  Continue Montelukast 10 mg - 1 tablet 1 time per day  3.  If needed:   A. Cetirizine 10 mg - 1-2 tablets 1-2 times per day (max=40mg )  B. Auvi-Q 0.3, Benadryl, MD/ER evaluation for allergic reaction  4. Obtain fall flu vaccine   5. Return to clinic in 12 months or earlier if problem   Chidera Kuan is doing quite well while using omalizumab to control her chronic urticaria.  She does have the option of decreasing her dose from 300 to 150 mg should she feel comfortable doing so.  She can continue on montelukast for her upper airway issue.  She can continue to use injectable epinephrine should it be required for allergic reaction following exposure to hymenoptera venom.  I will see her back in this clinic in 1 year or earlier if there is a problem.  Allena Katz, MD Allergy / Immunology [place]

## 2022-06-18 NOTE — Patient Instructions (Signed)
  1.  Continue Omalizumab 150 - 300 mg every 4 weeks  2.  Continue Montelukast 10 mg - 1 tablet 1 time per day  3.  If needed:   A. Cetirizine 10 mg - 1-2 tablets 1-2 times per day (max=40mg )  B. Auvi-Q 0.3, Benadryl, MD/ER evaluation for allergic reaction  4. Obtain fall flu vaccine   5. Return to clinic in 12 months or earlier if problem

## 2022-06-19 ENCOUNTER — Ambulatory Visit: Payer: Managed Care, Other (non HMO)

## 2022-06-19 ENCOUNTER — Encounter: Payer: Self-pay | Admitting: Allergy and Immunology

## 2022-06-19 MED ORDER — MONTELUKAST SODIUM 10 MG PO TABS: 10.0000 mg | ORAL_TABLET | Freq: Every day | ORAL | 3 refills | Status: AC

## 2022-07-16 ENCOUNTER — Ambulatory Visit: Payer: Managed Care, Other (non HMO)

## 2022-07-17 ENCOUNTER — Ambulatory Visit: Payer: Managed Care, Other (non HMO)

## 2022-11-11 ENCOUNTER — Telehealth: Payer: Self-pay | Admitting: Pharmacy Technician

## 2022-11-11 NOTE — Telephone Encounter (Signed)
Patient Advocate Encounter  Prior Authorization for AJOVY (fremanezumab-vfrm) injection 225MG/1.5ML syringes has been approved.    PA# [id number] Key: [id number] Effective dates: 11/10/2022 through 11/11/2023      Lyndel Safe, Jeff Patient Advocate Specialist [facility] Patient Advocate Team Direct Number: [phone]  Fax: [phone]

## 2022-11-27 ENCOUNTER — Other Ambulatory Visit: Payer: Self-pay | Admitting: Neurology

## 2022-11-27 DIAGNOSIS — G43709 Chronic migraine without aura, not intractable, without status migrainosus: Secondary | ICD-10-CM
